# Patient Record
Sex: Female | Born: 1965 | Race: White | Hispanic: No | Smoking: Never smoker
Health system: Southern US, Community
[De-identification: ages and names within clinical notes are randomized; demographics above are authoritative.]

## PROBLEM LIST (undated history)

## (undated) DIAGNOSIS — F509 Eating disorder, unspecified: Secondary | ICD-10-CM

## (undated) DIAGNOSIS — D0439 Carcinoma in situ of skin of other parts of face: Secondary | ICD-10-CM

## (undated) DIAGNOSIS — R51 Headache: Secondary | ICD-10-CM

## (undated) DIAGNOSIS — M542 Cervicalgia: Secondary | ICD-10-CM

## (undated) DIAGNOSIS — M5030 Other cervical disc degeneration, unspecified cervical region: Secondary | ICD-10-CM

## (undated) DIAGNOSIS — M797 Fibromyalgia: Secondary | ICD-10-CM

## (undated) HISTORY — DX: Eating disorder, unspecified: F50.9

## (undated) HISTORY — PX: OTHER SURGICAL HISTORY: SHX169

## (undated) HISTORY — DX: Fibromyalgia: M79.7

## (undated) HISTORY — PX: TUBAL LIGATION: SHX77

## (undated) HISTORY — DX: Other cervical disc degeneration, unspecified cervical region: M50.30

## (undated) HISTORY — DX: Headache: R51

---

## 1998-08-12 ENCOUNTER — Ambulatory Visit (HOSPITAL_COMMUNITY): Admission: RE | Admit: 1998-08-12 | Discharge: 1998-08-12 | Payer: Self-pay | Admitting: Internal Medicine

## 1999-10-16 ENCOUNTER — Encounter: Admission: RE | Admit: 1999-10-16 | Discharge: 1999-10-16 | Payer: Self-pay | Admitting: Hematology and Oncology

## 1999-10-16 ENCOUNTER — Encounter: Payer: Self-pay | Admitting: Hematology and Oncology

## 2001-06-10 ENCOUNTER — Encounter: Admission: RE | Admit: 2001-06-10 | Discharge: 2001-06-10 | Payer: Self-pay | Admitting: Geriatric Medicine

## 2001-06-10 ENCOUNTER — Encounter: Payer: Self-pay | Admitting: Geriatric Medicine

## 2003-06-07 ENCOUNTER — Emergency Department (HOSPITAL_COMMUNITY): Admission: AD | Admit: 2003-06-07 | Discharge: 2003-06-07 | Payer: Self-pay | Admitting: Family Medicine

## 2003-07-08 ENCOUNTER — Other Ambulatory Visit: Admission: RE | Admit: 2003-07-08 | Discharge: 2003-07-08 | Payer: Self-pay | Admitting: Internal Medicine

## 2004-04-22 ENCOUNTER — Encounter: Admission: RE | Admit: 2004-04-22 | Discharge: 2004-04-22 | Payer: Self-pay | Admitting: Orthopaedic Surgery

## 2004-04-28 ENCOUNTER — Encounter: Admission: RE | Admit: 2004-04-28 | Discharge: 2004-06-02 | Payer: Self-pay | Admitting: Orthopaedic Surgery

## 2004-05-02 ENCOUNTER — Emergency Department (HOSPITAL_COMMUNITY): Admission: EM | Admit: 2004-05-02 | Discharge: 2004-05-02 | Payer: Self-pay | Admitting: Family Medicine

## 2004-05-05 ENCOUNTER — Emergency Department (HOSPITAL_COMMUNITY): Admission: EM | Admit: 2004-05-05 | Discharge: 2004-05-05 | Payer: Self-pay | Admitting: Family Medicine

## 2005-06-01 ENCOUNTER — Emergency Department (HOSPITAL_COMMUNITY): Admission: EM | Admit: 2005-06-01 | Discharge: 2005-06-01 | Payer: Self-pay | Admitting: Emergency Medicine

## 2005-07-24 ENCOUNTER — Encounter: Admission: RE | Admit: 2005-07-24 | Discharge: 2005-07-24 | Payer: Self-pay | Admitting: Internal Medicine

## 2006-08-16 ENCOUNTER — Other Ambulatory Visit: Admission: RE | Admit: 2006-08-16 | Discharge: 2006-08-16 | Payer: Self-pay | Admitting: Internal Medicine

## 2006-10-17 ENCOUNTER — Ambulatory Visit (HOSPITAL_COMMUNITY): Admission: RE | Admit: 2006-10-17 | Discharge: 2006-10-17 | Payer: Self-pay | Admitting: Obstetrics and Gynecology

## 2007-04-28 ENCOUNTER — Emergency Department (HOSPITAL_COMMUNITY): Admission: EM | Admit: 2007-04-28 | Discharge: 2007-04-28 | Payer: Self-pay | Admitting: Emergency Medicine

## 2007-07-10 ENCOUNTER — Emergency Department (HOSPITAL_COMMUNITY): Admission: EM | Admit: 2007-07-10 | Discharge: 2007-07-10 | Payer: Self-pay | Admitting: Family Medicine

## 2007-09-22 ENCOUNTER — Emergency Department (HOSPITAL_COMMUNITY): Admission: EM | Admit: 2007-09-22 | Discharge: 2007-09-22 | Payer: Self-pay | Admitting: Emergency Medicine

## 2007-10-01 ENCOUNTER — Emergency Department (HOSPITAL_COMMUNITY): Admission: EM | Admit: 2007-10-01 | Discharge: 2007-10-01 | Payer: Self-pay | Admitting: Family Medicine

## 2008-04-22 ENCOUNTER — Encounter: Admission: RE | Admit: 2008-04-22 | Discharge: 2008-04-22 | Payer: Self-pay | Admitting: Internal Medicine

## 2008-05-19 ENCOUNTER — Emergency Department (HOSPITAL_COMMUNITY): Admission: EM | Admit: 2008-05-19 | Discharge: 2008-05-19 | Payer: Self-pay | Admitting: Emergency Medicine

## 2008-08-24 ENCOUNTER — Other Ambulatory Visit: Admission: RE | Admit: 2008-08-24 | Discharge: 2008-08-24 | Payer: Self-pay | Admitting: Geriatric Medicine

## 2008-09-11 ENCOUNTER — Emergency Department (HOSPITAL_COMMUNITY): Admission: EM | Admit: 2008-09-11 | Discharge: 2008-09-11 | Payer: Self-pay | Admitting: Emergency Medicine

## 2008-11-24 ENCOUNTER — Emergency Department (HOSPITAL_COMMUNITY): Admission: EM | Admit: 2008-11-24 | Discharge: 2008-11-24 | Payer: Self-pay | Admitting: Emergency Medicine

## 2009-06-03 ENCOUNTER — Encounter: Admission: RE | Admit: 2009-06-03 | Discharge: 2009-06-03 | Payer: Self-pay | Admitting: Family Medicine

## 2010-06-12 ENCOUNTER — Other Ambulatory Visit: Payer: Self-pay | Admitting: Internal Medicine

## 2010-06-12 DIAGNOSIS — Z1231 Encounter for screening mammogram for malignant neoplasm of breast: Secondary | ICD-10-CM

## 2010-06-21 ENCOUNTER — Ambulatory Visit
Admission: RE | Admit: 2010-06-21 | Discharge: 2010-06-21 | Disposition: A | Discharge status: Home / Self Care | Payer: Self-pay | Source: Ambulatory Visit | Attending: Internal Medicine | Admitting: Internal Medicine

## 2010-06-21 DIAGNOSIS — Z1231 Encounter for screening mammogram for malignant neoplasm of breast: Secondary | ICD-10-CM

## 2010-08-08 ENCOUNTER — Ambulatory Visit (HOSPITAL_COMMUNITY): Payer: Self-pay | Admitting: Psychiatry

## 2010-08-13 LAB — BASIC METABOLIC PANEL
BUN: 6 mg/dL (ref 6–23)
CO2: 27 mEq/L (ref 19–32)
Calcium: 9.6 mg/dL (ref 8.4–10.5)
Chloride: 105 mEq/L (ref 96–112)
Creatinine, Ser: 0.7 mg/dL (ref 0.4–1.2)
GFR calc Af Amer: >60 mL/min (ref >60)
GFR calc non Af Amer: >60 mL/min (ref >60)
Glucose, Bld: 107 mg/dL — ABNORMAL HIGH (ref 70–99)
Potassium: 4.4 mEq/L (ref 3.5–5.1)
Sodium: 140 mEq/L (ref 135–145)

## 2010-08-13 LAB — CBC
HCT: 40.5 % (ref 36.0–46.0)
Hemoglobin: 13.3 g/dL (ref 12.0–15.0)
MCHC: 32.9 g/dL (ref 30.0–36.0)
MCV: 74.2 fL — ABNORMAL LOW (ref 78.0–100.0)
Platelets: 289 10*3/uL (ref 150–400)
RBC: 5.46 MIL/uL — ABNORMAL HIGH (ref 3.87–5.11)
RDW: 14.2 % (ref 11.5–15.5)
WBC: 10.7 10*3/uL — ABNORMAL HIGH (ref 4.0–10.5)

## 2010-08-13 LAB — DIFFERENTIAL
Basophils Absolute: 0.1 10*3/uL (ref 0.0–0.1)
Basophils Relative: 1 % (ref 0–1)
Eosinophils Absolute: 0.1 10*3/uL (ref 0.0–0.7)
Eosinophils Relative: 1 % (ref 0–5)
Lymphocytes Relative: 15 % (ref 12–46)
Lymphs Abs: 1.6 10*3/uL (ref 0.7–4.0)
Monocytes Absolute: 0.4 10*3/uL (ref 0.1–1.0)
Monocytes Relative: 4 % (ref 3–12)
Neutro Abs: 8.5 10*3/uL — ABNORMAL HIGH (ref 1.7–7.7)
Neutrophils Relative %: 80 % — ABNORMAL HIGH (ref 43–77)

## 2010-08-15 LAB — POCT RAPID STREP A (OFFICE): Streptococcus, Group A Screen (Direct): NEGATIVE

## 2010-08-21 LAB — RAPID STREP SCREEN (MED CTR MEBANE ONLY): Streptococcus, Group A Screen (Direct): NEGATIVE

## 2010-08-25 ENCOUNTER — Other Ambulatory Visit: Payer: Self-pay | Admitting: Internal Medicine

## 2010-08-25 ENCOUNTER — Ambulatory Visit
Admission: RE | Admit: 2010-08-25 | Discharge: 2010-08-25 | Disposition: A | Discharge status: Home / Self Care | Payer: BC Managed Care – PPO | Source: Ambulatory Visit | Attending: Internal Medicine | Admitting: Internal Medicine

## 2010-08-25 DIAGNOSIS — R1013 Epigastric pain: Secondary | ICD-10-CM

## 2010-09-19 NOTE — Op Note (Signed)
NAME:  Gabriela Schultz, Gabriela Schultz NO.:  [id number]   MEDICAL RECORD NO.:  [id number]          PATIENT TYPE:  AMB   LOCATION:  SDC                           FACILITY:  [facility]   PHYSICIAN:  Gerald Leitz, MD          DATE OF BIRTH:  26-Feb-1966   DATE OF PROCEDURE:  10/17/2006  DATE OF DISCHARGE:                               OPERATIVE REPORT   PREOPERATIVE DIAGNOSIS:  Desires permanent sterilization.   POSTOPERATIVE DIAGNOSIS:  Desires permanent sterilization.   PROCEDURE:  Laparoscopic bilateral tubal ligation with Filshie clips.   SURGEON:  Dr. Gerald Leitz   ASSISTANT:  None.   ANESTHESIA:  General.   SPECIMEN:  None.   ESTIMATED BLOOD LOSS:  Minimal, approximately 5-10 mL.   COMPLICATIONS:  None.   PROCEDURE:  Informed consent was obtained. The patient was taken to the  operating room where she was placed under general anesthesia. She was  placed in dorsal lithotomy position, prepped and draped in the usual  sterile fashion.  Bivalve speculum placed into the vaginal vault and a  uterine manipulator was placed. Bivalve speculum was removed.  She was  draped in a sterile fashion.  A 5 mm incision was made at the umbilicus  and a 5 mm Excel trocar was placed under direct visualization and  pneumoperitoneum was attained with CO2 gas. Ovaries and fallopian tubes  and uterus appeared normal. 10 mm incision was made 2 cm above the pubic  symphysis and the 10 mm trocar was placed under direct visualization.  Filshie clips were inserted through the 10 mm trocar. They were placed  on each fallopian tube bilaterally. Pneumoperitoneum and was released.  The trocars were removed from the patient's abdomen under direct  visualization. The fascia of the 10 mm incision was closed with 0 Vicryl  and the skin incisions were closed with 4-0 Vicryl.  Sponge, lap, needle  counts were correct x2. The patient was awakened from anesthesia, taken  to the recovery room in stable condition.   FINDINGS:  Normal uterus, ovaries and fallopian tubes.      Gerald Leitz, MD  Electronically Signed    TC/MEDQ  D:  10/17/2006  T:  10/17/2006  Job:  [id number]
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
## 2010-12-18 ENCOUNTER — Inpatient Hospital Stay (INDEPENDENT_AMBULATORY_CARE_PROVIDER_SITE_OTHER)
Admission: RE | Admit: 2010-12-18 | Discharge: 2010-12-18 | Disposition: A | Discharge status: Home / Self Care | Payer: BC Managed Care – PPO | Source: Ambulatory Visit | Attending: Family Medicine | Admitting: Family Medicine

## 2010-12-18 DIAGNOSIS — T6391XA Toxic effect of contact with unspecified venomous animal, accidental (unintentional), initial encounter: Secondary | ICD-10-CM

## 2011-01-02 ENCOUNTER — Ambulatory Visit (INDEPENDENT_AMBULATORY_CARE_PROVIDER_SITE_OTHER): Payer: BC Managed Care – PPO | Admitting: Psychiatry

## 2011-01-02 DIAGNOSIS — F329 Major depressive disorder, single episode, unspecified: Secondary | ICD-10-CM

## 2011-01-02 DIAGNOSIS — F3289 Other specified depressive episodes: Secondary | ICD-10-CM

## 2011-01-11 ENCOUNTER — Encounter (INDEPENDENT_AMBULATORY_CARE_PROVIDER_SITE_OTHER): Payer: BC Managed Care – PPO | Admitting: Psychiatry

## 2011-01-11 DIAGNOSIS — F329 Major depressive disorder, single episode, unspecified: Secondary | ICD-10-CM

## 2011-01-11 DIAGNOSIS — F3289 Other specified depressive episodes: Secondary | ICD-10-CM

## 2011-01-19 ENCOUNTER — Encounter (INDEPENDENT_AMBULATORY_CARE_PROVIDER_SITE_OTHER): Payer: BC Managed Care – PPO | Admitting: Psychiatry

## 2011-01-19 DIAGNOSIS — F329 Major depressive disorder, single episode, unspecified: Secondary | ICD-10-CM

## 2011-01-19 DIAGNOSIS — F3289 Other specified depressive episodes: Secondary | ICD-10-CM

## 2011-01-30 ENCOUNTER — Encounter (HOSPITAL_COMMUNITY): Payer: BC Managed Care – PPO | Admitting: Psychiatry

## 2011-02-06 ENCOUNTER — Encounter (INDEPENDENT_AMBULATORY_CARE_PROVIDER_SITE_OTHER): Payer: BC Managed Care – PPO | Admitting: Psychiatry

## 2011-02-06 DIAGNOSIS — F3289 Other specified depressive episodes: Secondary | ICD-10-CM

## 2011-02-06 DIAGNOSIS — F329 Major depressive disorder, single episode, unspecified: Secondary | ICD-10-CM

## 2011-02-20 ENCOUNTER — Encounter (INDEPENDENT_AMBULATORY_CARE_PROVIDER_SITE_OTHER): Payer: BC Managed Care – PPO | Admitting: Psychiatry

## 2011-02-20 DIAGNOSIS — F329 Major depressive disorder, single episode, unspecified: Secondary | ICD-10-CM

## 2011-02-20 DIAGNOSIS — F3289 Other specified depressive episodes: Secondary | ICD-10-CM

## 2011-02-22 LAB — CBC
HCT: 40.8
Hemoglobin: 13.1
MCHC: 32.1
MCV: 74.1 — ABNORMAL LOW
Platelets: 273
RBC: 5.51 — ABNORMAL HIGH
RDW: 13.9
WBC: 6.4

## 2011-02-22 LAB — SAMPLE TO BLOOD BANK

## 2011-02-22 LAB — PREGNANCY, URINE: Preg Test, Ur: NEGATIVE

## 2011-02-27 ENCOUNTER — Encounter (HOSPITAL_COMMUNITY): Payer: BC Managed Care – PPO | Admitting: Psychiatry

## 2011-10-17 ENCOUNTER — Other Ambulatory Visit: Payer: Self-pay | Admitting: Internal Medicine

## 2011-10-17 DIAGNOSIS — R102 Pelvic and perineal pain: Secondary | ICD-10-CM

## 2011-10-18 ENCOUNTER — Other Ambulatory Visit: Payer: BC Managed Care – PPO

## 2011-10-19 ENCOUNTER — Other Ambulatory Visit: Payer: BC Managed Care – PPO

## 2011-10-23 ENCOUNTER — Ambulatory Visit
Admission: RE | Admit: 2011-10-23 | Discharge: 2011-10-23 | Disposition: A | Discharge status: Home / Self Care | Payer: BC Managed Care – PPO | Source: Ambulatory Visit | Attending: Internal Medicine | Admitting: Internal Medicine

## 2011-10-23 DIAGNOSIS — R102 Pelvic and perineal pain: Secondary | ICD-10-CM

## 2011-11-27 ENCOUNTER — Other Ambulatory Visit: Payer: Self-pay | Admitting: Hematology & Oncology

## 2012-02-07 ENCOUNTER — Ambulatory Visit (INDEPENDENT_AMBULATORY_CARE_PROVIDER_SITE_OTHER): Payer: BC Managed Care – PPO | Admitting: Psychology

## 2012-02-07 DIAGNOSIS — F339 Major depressive disorder, recurrent, unspecified: Secondary | ICD-10-CM

## 2012-02-07 DIAGNOSIS — F419 Anxiety disorder, unspecified: Secondary | ICD-10-CM

## 2012-02-07 DIAGNOSIS — F411 Generalized anxiety disorder: Secondary | ICD-10-CM

## 2012-02-08 ENCOUNTER — Encounter (HOSPITAL_COMMUNITY): Payer: Self-pay | Admitting: Psychology
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
## 2012-02-18 ENCOUNTER — Ambulatory Visit (HOSPITAL_COMMUNITY): Payer: BC Managed Care – PPO | Admitting: Psychiatry

## 2012-03-04 ENCOUNTER — Ambulatory Visit (HOSPITAL_COMMUNITY): Payer: Self-pay | Admitting: Psychology

## 2012-04-03 ENCOUNTER — Encounter (HOSPITAL_COMMUNITY): Payer: Self-pay | Admitting: *Deleted

## 2012-04-03 ENCOUNTER — Emergency Department (HOSPITAL_COMMUNITY)
Admission: EM | Admit: 2012-04-03 | Discharge: 2012-04-03 | Disposition: A | Discharge status: Home / Self Care | Payer: BC Managed Care – PPO | Attending: Emergency Medicine | Admitting: Emergency Medicine

## 2012-04-03 DIAGNOSIS — Z79899 Other long term (current) drug therapy: Secondary | ICD-10-CM | POA: Insufficient documentation

## 2012-04-03 DIAGNOSIS — K644 Residual hemorrhoidal skin tags: Secondary | ICD-10-CM | POA: Insufficient documentation

## 2012-04-03 DIAGNOSIS — Z8659 Personal history of other mental and behavioral disorders: Secondary | ICD-10-CM | POA: Insufficient documentation

## 2012-04-03 DIAGNOSIS — K649 Unspecified hemorrhoids: Secondary | ICD-10-CM

## 2012-04-03 DIAGNOSIS — Z8739 Personal history of other diseases of the musculoskeletal system and connective tissue: Secondary | ICD-10-CM | POA: Insufficient documentation

## 2012-04-03 MED ORDER — HYDROCORTISONE ACETATE 25 MG RE SUPP: 25.0000 mg | Freq: Two times a day (BID) | RECTAL | Status: DC

## 2012-04-03 MED ORDER — OXYCODONE-ACETAMINOPHEN 5-325 MG PO TABS: 1.0000 | ORAL_TABLET | Freq: Four times a day (QID) | ORAL | Status: DC | PRN

## 2012-04-03 MED ORDER — ONDANSETRON HCL 4 MG PO TABS
4.0000 mg | ORAL_TABLET | Freq: Once | ORAL | Status: AC
  Administered 2012-04-03: 4 mg via ORAL
  Filled 2012-04-03: qty 1

## 2012-04-03 MED ORDER — OXYCODONE-ACETAMINOPHEN 5-325 MG PO TABS
1.0000 | ORAL_TABLET | Freq: Once | ORAL | Status: AC
  Administered 2012-04-03: 1 via ORAL
  Filled 2012-04-03: qty 1

## 2012-04-03 NOTE — ED Provider Notes (Signed)
Medical screening examination/treatment/procedure(s) were performed by non-physician practitioner and as supervising physician I was immediately available for consultation/collaboration. Devoria Albe, MD, Armando Gang   Ward Givens, MD 04/03/12 [phone]

## 2012-04-03 NOTE — ED Notes (Signed)
Pt presents with rectal pain secondary to Hemorid. Pt denies bleeding. Pt states has not had a BM today. Pain has increased last night.

## 2012-04-03 NOTE — ED Provider Notes (Signed)
History     CSN: [id number]  Arrival date & time 04/03/12  2104   First MD Initiated Contact with Patient 04/03/12 2152      Chief Complaint  Patient presents with  . Hemorrhoids    (Consider location/radiation/quality/duration/timing/severity/associated sxs/prior treatment) HPI Comments: Pt present to the ED with a 2 day hx of hemorrhoid pain. She notes the pain is getting worse. She used a Ship broker and noted a black area on the hemorrhoid. She states she can not take the pain any more and request assistance. No previous hemorrhoid/rectal  Surgery. She has tried tylenol an motrin without improvement.  The history is provided by the patient.    Past Medical History  Diagnosis Date  . Eating disorder   . Headache   . Fibromyalgia     Past Surgical History  Procedure Date  . Tubal ligation   . 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 Anxiety disorder Mother   . Anxiety disorder Maternal Aunt   . Depression Maternal Grandmother   . Depression Paternal Grandmother     History  Substance Use Topics  . Smoking status: Never Smoker   . Smokeless tobacco: Never Used  . Alcohol Use: No    OB History    Grav Para Term Preterm Abortions TAB SAB Ect Mult Living                  Review of Systems  Constitutional: Negative for activity change.       All ROS Neg except as noted in HPI  HENT: Negative for nosebleeds and neck pain.   Eyes: Negative for photophobia and discharge.  Respiratory: Negative for cough, shortness of breath and wheezing.   Cardiovascular: Negative for chest pain and palpitations.  Gastrointestinal: Positive for constipation and rectal pain. Negative for abdominal pain and blood in stool.  Genitourinary: Negative for dysuria, frequency and hematuria.  Musculoskeletal: Negative for back pain and arthralgias.  Skin: Negative.   Neurological: Negative for dizziness, seizures and  speech difficulty.  Psychiatric/Behavioral: Negative for hallucinations and confusion.    Allergies  Erythromycin; Flagyl; and Vicodin  Home Medications   Current Outpatient Rx  Name  Route  Sig  Dispense  Refill  . ACETAMINOPHEN 500 MG PO TABS   Oral   Take 1,000 mg by mouth daily as needed. For occasional pain         . ALPRAZOLAM 1 MG PO TABS   Oral   Take 0.5 mg by mouth daily as needed. For anxiety         . AMINO ACID PO   Oral   Take 2 tablets by mouth daily. [employer] Supp[employer]:         . DROSPIREN-ETH ESTRAD-LEVOMEFOL 3-0.02-0.451 MG PO TABS   Oral   Take 1 tablet by mouth at bedtime.         . OMEGA-3 FATTY ACIDS 1000 MG PO CAPS   Oral   Take 1 g by mouth every morning.         Marland Kitchen FLUOXETINE HCL 20 MG PO TABS   Oral   Take 60 mg by mouth at bedtime.          Marland Kitchen PREGABALIN 50 MG PO CAPS   Oral   Take 50 mg by mouth at bedtime.         Marland Kitchen PREPARATION H EX   Apply externally  Apply 1 application topically daily as needed. For relief           BP 101/78  Pulse 82  Temp 97.7 F (36.5 C) (Oral)  Resp 20  Ht 5\' 3"  (1.6 m)  Wt 149 lb (67.586 kg)  BMI 26.39 kg/m2  SpO2 100%  LMP 03/29/2012  Physical Exam  Nursing note and vitals reviewed. Constitutional: She is oriented to person, place, and time. She appears well-developed and well-nourished.  Non-toxic appearance.  HENT:  Head: Normocephalic.  Right Ear: Tympanic membrane and external ear normal.  Left Ear: Tympanic membrane and external ear normal.  Eyes: EOM and lids are normal. Pupils are equal, round, and reactive to light.  Neck: Normal range of motion. Neck supple. Carotid bruit is not present.  Cardiovascular: Normal rate, regular rhythm, normal heart sounds, intact distal pulses and normal pulses.   Pulmonary/Chest: Breath sounds normal. No respiratory distress.  Abdominal: Soft. Bowel sounds are normal. There is no tenderness. There is no guarding.  Genitourinary:        Hemorrhoid at the 10:00 position. Small area or dark blue/black at the tip of the hemorrhoid. No noted fistula or abscess in the rectal area. Good sphincter tone on limited exam. No red streaking. Could not assess for internal hemorrhoid. Chaperone in room during exam.  Musculoskeletal: Normal range of motion.  Lymphadenopathy:       Head (right side): No submandibular adenopathy present.       Head (left side): No submandibular adenopathy present.    She has no cervical adenopathy.  Neurological: She is alert and oriented to person, place, and time. She has normal strength. No cranial nerve deficit or sensory deficit.  Skin: Skin is warm and dry.  Psychiatric: She has a normal mood and affect. Her speech is normal.    ED Course  Procedures (including critical care time)  Labs Reviewed - No data to display No results found.   No diagnosis found.    MDM  I have reviewed nursing notes, vital signs, and all appropriate lab and imaging results for this patient. Pt has an external hemorrhoid. Pt advised to increase water and  Juices. Use warm tub soaks, and mirralax. Pt to see Dr Lovell Sheehan for surgical eval, if not improving. Rx for percocet and anusol HC given to the patient.      Kathie Dike, [place] 04/03/12 2250

## 2012-04-03 NOTE — ED Notes (Signed)
Pt with rectal pain since last night, hurts worse with standing

## 2012-06-08 ENCOUNTER — Emergency Department (HOSPITAL_COMMUNITY)
Admission: EM | Admit: 2012-06-08 | Discharge: 2012-06-08 | Disposition: A | Discharge status: Home / Self Care | Payer: BC Managed Care – PPO | Attending: Emergency Medicine | Admitting: Emergency Medicine

## 2012-06-08 ENCOUNTER — Encounter (HOSPITAL_COMMUNITY): Payer: Self-pay | Admitting: Emergency Medicine

## 2012-06-08 DIAGNOSIS — J029 Acute pharyngitis, unspecified: Secondary | ICD-10-CM | POA: Insufficient documentation

## 2012-06-08 DIAGNOSIS — H5789 Other specified disorders of eye and adnexa: Secondary | ICD-10-CM | POA: Insufficient documentation

## 2012-06-08 DIAGNOSIS — R509 Fever, unspecified: Secondary | ICD-10-CM | POA: Insufficient documentation

## 2012-06-08 DIAGNOSIS — J111 Influenza due to unidentified influenza virus with other respiratory manifestations: Secondary | ICD-10-CM

## 2012-06-08 DIAGNOSIS — R52 Pain, unspecified: Secondary | ICD-10-CM | POA: Insufficient documentation

## 2012-06-08 DIAGNOSIS — J3489 Other specified disorders of nose and nasal sinuses: Secondary | ICD-10-CM | POA: Insufficient documentation

## 2012-06-08 DIAGNOSIS — Z8739 Personal history of other diseases of the musculoskeletal system and connective tissue: Secondary | ICD-10-CM | POA: Insufficient documentation

## 2012-06-08 DIAGNOSIS — R51 Headache: Secondary | ICD-10-CM | POA: Insufficient documentation

## 2012-06-08 DIAGNOSIS — Z79899 Other long term (current) drug therapy: Secondary | ICD-10-CM | POA: Insufficient documentation

## 2012-06-08 MED ORDER — OSELTAMIVIR PHOSPHATE 75 MG PO CAPS: 75.0000 mg | ORAL_CAPSULE | Freq: Two times a day (BID) | ORAL | Status: DC

## 2012-06-08 MED ORDER — GUAIFENESIN-CODEINE 100-10 MG/5ML PO SYRP: 5.0000 mL | ORAL_SOLUTION | Freq: Three times a day (TID) | ORAL | Status: DC | PRN

## 2012-06-08 NOTE — ED Notes (Signed)
Pt c/o cough/chills/body aches/sore throat since Friday. Denies n/v/d.

## 2012-06-08 NOTE — ED Provider Notes (Signed)
Medical screening examination/treatment/procedure(s) were performed by non-physician practitioner and as supervising physician I was immediately available for consultation/collaboration.   Lyanne Co, MD 06/08/12 [phone]

## 2012-06-08 NOTE — ED Notes (Signed)
Pt with NP cough, body aches, fever, HA and sore throat since Friday evening, taking NyQuil and last took this morning, denies any N/V/D

## 2012-06-08 NOTE — ED Provider Notes (Signed)
History     CSN: [id number]  Arrival date & time 06/08/12  1322   First MD Initiated Contact with Patient 06/08/12 1422      Chief Complaint  Patient presents with  . Cough  . Generalized Body Aches  . Sore Throat  . Chills    HPI Gabriela Schultz is a 47 y.o. female who presents to the ED with flu like symptoms. The symptoms started 2 days ago and include cough, sore throat,  Fever, chills and generalized body aches. Temp has been up to 101. She did not have her flu vaccine this year. Taking OTC medication without relief.  The history was provided by the patient.  Past Medical History  Diagnosis Date  . Eating disorder   . Headache   . Fibromyalgia     Past Surgical History  Procedure Date  . Tubal ligation   . 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 Anxiety disorder Mother   . Anxiety disorder Maternal Aunt   . Depression Maternal Grandmother   . Depression Paternal Grandmother     History  Substance Use Topics  . Smoking status: Never Smoker   . Smokeless tobacco: Never Used  . Alcohol Use: No    OB History    Grav Para Term Preterm Abortions TAB SAB Ect Mult Living                  Review of Systems  Constitutional: Positive for fever and chills. Negative for diaphoresis and fatigue.  HENT: Positive for congestion and sore throat. Negative for ear pain, facial swelling, neck pain, neck stiffness, dental problem and sinus pressure.   Eyes: Positive for redness. Negative for photophobia, pain and discharge.  Respiratory: Positive for cough. Negative for chest tightness and wheezing.   Gastrointestinal: Negative for nausea, vomiting, abdominal pain, diarrhea, constipation and abdominal distention.  Genitourinary: Negative for dysuria, frequency, flank pain and difficulty urinating.  Musculoskeletal: Positive for myalgias. Negative for back pain and gait problem.  Skin: Negative for color  change and rash.  Neurological: Positive for headaches. Negative for dizziness, speech difficulty, weakness, light-headedness and numbness.  Psychiatric/Behavioral: Negative for confusion and agitation. The patient is not nervous/anxious.     Allergies  Erythromycin; Flagyl; and Vicodin  Home Medications   Current Outpatient Rx  Name  Route  Sig  Dispense  Refill  . ACETAMINOPHEN 500 MG PO TABS   Oral   Take 1,000 mg by mouth daily as needed. For occasional pain         . ALPRAZOLAM 1 MG PO TABS   Oral   Take 0.5 mg by mouth daily as needed. For anxiety         . AMINO ACID PO   Oral   Take 2 tablets by mouth daily. [employer] Supp[employer]:         . DROSPIREN-ETH ESTRAD-LEVOMEFOL 3-0.02-0.451 MG PO TABS   Oral   Take 1 tablet by mouth at bedtime.         . OMEGA-3 FATTY ACIDS 1000 MG PO CAPS   Oral   Take 1 g by mouth every morning.         Marland Kitchen FLUOXETINE HCL 20 MG PO TABS   Oral   Take 60 mg by mouth at bedtime.          Marland Kitchen HYDROCORTISONE ACETATE 25 MG RE SUPP  Rectal   Place 1 suppository (25 mg total) rectally 2 (two) times daily.   12 suppository   0   . OXYCODONE-ACETAMINOPHEN 5-325 MG PO TABS   Oral   Take 1 tablet by mouth every 6 (six) hours as needed for pain.   15 tablet   0     Please take with food   . PREGABALIN 50 MG PO CAPS   Oral   Take 50 mg by mouth at bedtime.         Marland Kitchen PREPARATION H EX   Apply externally   Apply 1 application topically daily as needed. For relief           BP 117/89  Pulse 114  Temp 98.3 F (36.8 C) (Oral)  Resp 20  Ht 5\' 3"  (1.6 m)  Wt 150 lb (68.04 kg)  BMI 26.57 kg/m2  SpO2 100%  LMP 06/02/2012  Physical Exam  Nursing note and vitals reviewed. Constitutional: She is oriented to person, place, and time. She appears well-developed and well-nourished. No distress.  HENT:  Head: Normocephalic and atraumatic.  Right Ear: Tympanic membrane, external ear and ear canal normal.  Left Ear: Tympanic  membrane, external ear and ear canal normal.  Nose: Mucosal edema present.  Mouth/Throat: Uvula is midline and mucous membranes are normal. Posterior oropharyngeal erythema present. No oropharyngeal exudate or posterior oropharyngeal edema.  Eyes: EOM are normal. Pupils are equal, round, and reactive to light.  Neck: Neck supple.  Cardiovascular:       tachycardia  Pulmonary/Chest: Effort normal and breath sounds normal. No respiratory distress.  Abdominal: Soft. There is no tenderness.  Musculoskeletal: Normal range of motion. She exhibits no edema.  Neurological: She is alert and oriented to person, place, and time. No cranial nerve deficit.  Skin: Skin is warm and dry.  Psychiatric: She has a normal mood and affect. Her behavior is normal. Judgment and thought content normal.   Procedures   Assessment: 47 y.o. female with flu like symptoms   Influenza  Plan:  Tamiflu   Robitussin AC   Rest, fluids Discussed with the patient and all questioned fully answered. She will return if any problems arise.    Medication List     As of 06/08/2012  3:18 PM    START taking these medications         guaiFENesin-codeine 100-10 MG/5ML syrup   Commonly known as: ROBITUSSIN AC   Take 5 mLs by mouth 3 (three) times daily as needed for cough.      oseltamivir 75 MG capsule   Commonly known as: TAMIFLU   Take 1 capsule (75 mg total) by mouth every 12 (twelve) hours.      ASK your doctor about these medications         acetaminophen 500 MG tablet   Commonly known as: TYLENOL      ALPRAZolam 1 MG tablet   Commonly known as: XANAX      BEYAZ 3-0.02-0.451 MG tablet   Generic drug: Drospirenone-Ethinyl Estradiol-Levomefol      fish oil-omega-3 fatty acids 1000 MG capsule      FLUoxetine 20 MG tablet   Commonly known as: PROZAC          Where to get your medications    These are the prescriptions that you need to pick up.   You may get these medications from any pharmacy.          guaiFENesin-codeine 100-10 MG/5ML syrup   oseltamivir  75 MG capsule                [facility], [place] 06/08/12 [phone]

## 2012-06-11 ENCOUNTER — Ambulatory Visit (HOSPITAL_COMMUNITY): Payer: Self-pay | Admitting: Psychiatry

## 2012-06-19 ENCOUNTER — Ambulatory Visit (HOSPITAL_COMMUNITY): Payer: Self-pay | Admitting: Psychiatry

## 2012-06-24 ENCOUNTER — Ambulatory Visit (INDEPENDENT_AMBULATORY_CARE_PROVIDER_SITE_OTHER): Payer: BC Managed Care – PPO | Admitting: Psychiatry

## 2012-06-24 DIAGNOSIS — F339 Major depressive disorder, recurrent, unspecified: Secondary | ICD-10-CM

## 2012-06-24 DIAGNOSIS — F411 Generalized anxiety disorder: Secondary | ICD-10-CM

## 2012-06-24 DIAGNOSIS — F419 Anxiety disorder, unspecified: Secondary | ICD-10-CM

## 2012-06-25 NOTE — Progress Notes (Signed)
Patient:  Gabriela Schultz   DOB: [birth year]  MR Number: [id number]  [facility]  [address] [place],  [place], [address]  Start: Tuesday 06/24/2012 3:00 PM End: Tuesday 06/24/2012 3:55 PM  Provider/Observer:     Florencia Reasons, MSW, LCSW   Chief Complaint:      Chief Complaint  Patient presents with  . Depression  . Anxiety    Reason For Service:     The patient is a returning patient to this practice and this clinician. She was last seen in this practice by Dr. Kieth Brightly in October 2013. She has a long history of symptoms of anxiety and depression that have worsened in recent weeks. 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   Interventions Strategy:   supportive therapy  Participation Level:   Active  Participation Quality:  Appropriate      Behavioral Observation:  Casual, Alert, and Appropriate.   Current Psychosocial Factors: Trust issues in marriage. Discord with in-laws.   Content of Session:   Reviewing symptoms, establishing rapport, gathering information  Current Status:   Patient reports depressed mood, anxiety, excessive worrying, excessive sleeping, and decreased appetite. She denies suicidal and homicidal ideations. She reports no psychotic symptoms.  Patient Progress:   Fair. Patient reports increased stress due to trust issues with her husband and feeling very insecure about their current residence as patient's name is not on the deed for  the home. She fears that her husband's family will put her out of the house if something happens to her husband. She expresses ambivalent feelings about continuing to reside with her husband and has considered moving out but remaining in the marriage. She expresses frustration and disappointment with her husband as he has withheld information from patient. She also expresses frustration as she states that her husband will not stand up to his mother, ex-wife, or his daughter. 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 She states being perfectionistic and constantly asking self is it  good enough. Patient also enjoys her 2 young grandchildren and sees them 2- 3 times per week.  Target Goals:   Establishing rapport  Last Reviewed:     Goals Addressed Today:    Establishing rapport  Impression/Diagnosis:   Patient presents with a history of mental health issues since 1991 at which time she sought treatment and was diagnosed with anorexia. She also has experienced recurrent periods of depression along with anxiety. Symptoms have worsened in recent months due to to marital stress, insecurity and instability regarding current residence, and her job performance. Current symptoms include depressed mood, anxiety, excessive worrying, excessive sleeping, and decreased appetite. Diagnoses: Major depressive disorder, recurrent, anxiety disorder NOS  Diagnosis:  Axis I: Major depressive disorder, recurrent  Anxiety disorder          Axis II: No diagnosis

## 2012-06-25 NOTE — Patient Instructions (Signed)
Discussed orally 

## 2012-07-04 ENCOUNTER — Other Ambulatory Visit: Payer: Self-pay

## 2012-07-04 ENCOUNTER — Other Ambulatory Visit (HOSPITAL_COMMUNITY)
Admission: RE | Admit: 2012-07-04 | Discharge: 2012-07-04 | Disposition: A | Discharge status: Home / Self Care | Payer: BC Managed Care – PPO | Source: Ambulatory Visit | Attending: Obstetrics and Gynecology | Admitting: Obstetrics and Gynecology

## 2012-07-04 ENCOUNTER — Other Ambulatory Visit: Payer: Self-pay | Admitting: Obstetrics and Gynecology

## 2012-07-04 DIAGNOSIS — Z01419 Encounter for gynecological examination (general) (routine) without abnormal findings: Secondary | ICD-10-CM | POA: Insufficient documentation

## 2012-07-04 DIAGNOSIS — Z1151 Encounter for screening for human papillomavirus (HPV): Secondary | ICD-10-CM | POA: Insufficient documentation

## 2012-07-04 DIAGNOSIS — Z1231 Encounter for screening mammogram for malignant neoplasm of breast: Secondary | ICD-10-CM

## 2012-07-08 ENCOUNTER — Ambulatory Visit (HOSPITAL_COMMUNITY): Payer: Self-pay | Admitting: Psychiatry

## 2012-07-15 ENCOUNTER — Ambulatory Visit (HOSPITAL_COMMUNITY): Payer: Self-pay | Admitting: Psychiatry

## 2012-07-18 ENCOUNTER — Ambulatory Visit (HOSPITAL_COMMUNITY): Payer: Self-pay | Admitting: Psychiatry

## 2012-07-29 ENCOUNTER — Ambulatory Visit (HOSPITAL_COMMUNITY): Payer: Self-pay | Admitting: Psychiatry

## 2012-08-04 ENCOUNTER — Ambulatory Visit: Payer: Self-pay

## 2012-09-01 ENCOUNTER — Ambulatory Visit: Payer: Self-pay

## 2012-09-11 ENCOUNTER — Emergency Department (HOSPITAL_COMMUNITY)
Admission: EM | Admit: 2012-09-11 | Discharge: 2012-09-12 | Disposition: A | Discharge status: Home / Self Care | Payer: BC Managed Care – PPO | Attending: Emergency Medicine | Admitting: Emergency Medicine

## 2012-09-11 ENCOUNTER — Encounter (HOSPITAL_COMMUNITY): Payer: Self-pay | Admitting: Emergency Medicine

## 2012-09-11 DIAGNOSIS — K219 Gastro-esophageal reflux disease without esophagitis: Secondary | ICD-10-CM | POA: Insufficient documentation

## 2012-09-11 DIAGNOSIS — R1013 Epigastric pain: Secondary | ICD-10-CM | POA: Insufficient documentation

## 2012-09-11 DIAGNOSIS — R109 Unspecified abdominal pain: Secondary | ICD-10-CM

## 2012-09-11 DIAGNOSIS — Z79899 Other long term (current) drug therapy: Secondary | ICD-10-CM | POA: Insufficient documentation

## 2012-09-11 DIAGNOSIS — R143 Flatulence: Secondary | ICD-10-CM | POA: Insufficient documentation

## 2012-09-11 DIAGNOSIS — R112 Nausea with vomiting, unspecified: Secondary | ICD-10-CM | POA: Insufficient documentation

## 2012-09-11 DIAGNOSIS — R142 Eructation: Secondary | ICD-10-CM | POA: Insufficient documentation

## 2012-09-11 DIAGNOSIS — R141 Gas pain: Secondary | ICD-10-CM | POA: Insufficient documentation

## 2012-09-11 DIAGNOSIS — Z8659 Personal history of other mental and behavioral disorders: Secondary | ICD-10-CM | POA: Insufficient documentation

## 2012-09-11 DIAGNOSIS — Z8739 Personal history of other diseases of the musculoskeletal system and connective tissue: Secondary | ICD-10-CM | POA: Insufficient documentation

## 2012-09-11 LAB — CBC WITH DIFFERENTIAL/PLATELET
Basophils Absolute: 0 10*3/uL (ref 0.0–0.1)
Basophils Relative: 0 % (ref 0–1)
Eosinophils Absolute: 0 10*3/uL (ref 0.0–0.7)
Eosinophils Relative: 0 % (ref 0–5)
HCT: 39.8 % (ref 36.0–46.0)
Hemoglobin: 13.1 g/dL (ref 12.0–15.0)
Lymphocytes Relative: 22 % (ref 12–46)
Lymphs Abs: 2.1 10*3/uL (ref 0.7–4.0)
MCH: 23.9 pg — ABNORMAL LOW (ref 26.0–34.0)
MCHC: 32.9 g/dL (ref 30.0–36.0)
MCV: 72.5 fL — ABNORMAL LOW (ref 78.0–100.0)
Monocytes Absolute: 0.4 10*3/uL (ref 0.1–1.0)
Monocytes Relative: 4 % (ref 3–12)
Neutro Abs: 6.9 10*3/uL (ref 1.7–7.7)
Neutrophils Relative %: 74 % (ref 43–77)
Platelets: 220 10*3/uL (ref 150–400)
RBC: 5.49 MIL/uL — ABNORMAL HIGH (ref 3.87–5.11)
RDW: 13.8 % (ref 11.5–15.5)
WBC: 9.4 10*3/uL (ref 4.0–10.5)

## 2012-09-11 LAB — URINALYSIS, ROUTINE W REFLEX MICROSCOPIC
Bilirubin Urine: NEGATIVE
Glucose, UA: NEGATIVE mg/dL
Hgb urine dipstick: NEGATIVE
Ketones, ur: NEGATIVE mg/dL
Nitrite: NEGATIVE
Protein, ur: NEGATIVE mg/dL
Specific Gravity, Urine: 1.01 (ref 1.005–1.030)
Urobilinogen, UA: 4 mg/dL — ABNORMAL HIGH (ref 0.0–1.0)
pH: 6.5 (ref 5.0–8.0)

## 2012-09-11 LAB — COMPREHENSIVE METABOLIC PANEL
ALT: 39 U/L — ABNORMAL HIGH (ref 0–35)
AST: 36 U/L (ref 0–37)
Albumin: 3.6 g/dL (ref 3.5–5.2)
Alkaline Phosphatase: 79 U/L (ref 39–117)
BUN: 5 mg/dL — ABNORMAL LOW (ref 6–23)
CO2: 23 mEq/L (ref 19–32)
Calcium: 9 mg/dL (ref 8.4–10.5)
Chloride: 103 mEq/L (ref 96–112)
Creatinine, Ser: 0.67 mg/dL (ref 0.50–1.10)
GFR calc Af Amer: >90 mL/min (ref >90)
GFR calc non Af Amer: >90 mL/min (ref >90)
Glucose, Bld: 105 mg/dL — ABNORMAL HIGH (ref 70–99)
Potassium: 3.6 mEq/L (ref 3.5–5.1)
Sodium: 139 mEq/L (ref 135–145)
Total Bilirubin: 0.7 mg/dL (ref 0.3–1.2)
Total Protein: 7.4 g/dL (ref 6.0–8.3)

## 2012-09-11 LAB — LIPASE, BLOOD: Lipase: 27 U/L (ref 11–59)

## 2012-09-11 LAB — URINE MICROSCOPIC-ADD ON

## 2012-09-11 MED ORDER — PANTOPRAZOLE SODIUM 40 MG IV SOLR
40.0000 mg | Freq: Once | INTRAVENOUS | Status: AC
  Administered 2012-09-11: 40 mg via INTRAVENOUS
  Filled 2012-09-11: qty 40

## 2012-09-11 MED ORDER — DIPHENHYDRAMINE HCL 50 MG/ML IJ SOLN
25.0000 mg | Freq: Once | INTRAMUSCULAR | Status: AC
  Administered 2012-09-11: via INTRAVENOUS
  Filled 2012-09-11: qty 1

## 2012-09-11 MED ORDER — SODIUM CHLORIDE 0.9 % IV SOLN: 1000.0000 mL | INTRAVENOUS | Status: DC

## 2012-09-11 MED ORDER — METOCLOPRAMIDE HCL 5 MG/ML IJ SOLN
10.0000 mg | Freq: Once | INTRAMUSCULAR | Status: AC
  Administered 2012-09-11: 10 mg via INTRAVENOUS
  Filled 2012-09-11: qty 2

## 2012-09-11 MED ORDER — SODIUM CHLORIDE 0.9 % IV SOLN
1000.0000 mL | Freq: Once | INTRAVENOUS | Status: AC
  Administered 2012-09-11: 1000 mL via INTRAVENOUS

## 2012-09-11 NOTE — ED Notes (Signed)
Pt states she has not vomited since Tuesday, but c/o nausea and abd pain.

## 2012-09-11 NOTE — ED Provider Notes (Signed)
History  This chart was scribed for Ward Givens, MD by Bennett Scrape, ED Scribe. This patient was seen in room [facility] and the patient's care was started at 10:17 PM.  CSN: [id number]  Arrival date & time 09/11/12  2030   First MD Initiated Contact with Patient 09/11/12 2146      Chief Complaint  Patient presents with  . Vomiting  . Abdominal Pain  . Nausea     Patient is a 47 y.o. female presenting with abdominal pain. The history is provided by the patient. No language interpreter was used.  Abdominal Pain Pain location:  Epigastric Pain quality: pressure   Pain radiates to:  Does not radiate Pain severity:  Mild Onset quality:  Gradual Duration:  4 days Timing:  Constant Progression:  Worsening Chronicity:  New Context: sick contacts and suspicious food intake   Relieved by:  Nothing Worsened by:  Eating Ineffective treatments:  None tried Associated symptoms: belching, nausea and vomiting (now resolved)   Associated symptoms: no chills, no diarrhea and no fever     HPI Comments: Gabriela Schultz is a 47 y.o. female who presents to the Emergency Department complaining of 4 days of gradual onset, gradually worsening, constant, epigastric abdominal pain described as non-radiating pressure with associated nausea and emesis. She states that she feels lightheaded with standing only but denies any syncopal episodes. The pain is worse with eating and drinking and she denies any alleviating factors. Pt reports that she attended a bridal shower party before the onset and states that she felt like the food she had eaten "was just sitting there like a rock". The next day after getting to work she developed nausea and a couple hours later began vomiting. She states that she had emesis 3 days ago and it burned when it came out. She denies any further episodes but states that she started having "rotten egg" tasting burps today. She confirms one sick contact with diarrhea but sick  contact stated that she was improving. Pt reports that she has been drinking water and Gatorade to stay hydrated since the onset.  She states that she is still urinating and is appears to be a normal color. She denies any prior h/o cholecystectomy and she denies having a family h/o gallbladder disease. She denies any prior diagnoses of ulcers. She admits that she has a h/o "nervous stomach" where she will get diarrhea but denies similarities. She denies fevers and diarrhea as associated symptoms.   She has a h/o fibromyalgia. Pt denies smoking and alcohol use.  PCP Dr Nehemiah Settle  Past Medical History  Diagnosis Date  . Eating disorder   . Headache   . Fibromyalgia     Past Surgical History  Procedure Laterality Date  . Tubal ligation    . 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 Anxiety disorder Mother   . Anxiety disorder Maternal Aunt   . Depression Maternal Grandmother   . Depression Paternal Grandmother     History  Substance Use Topics  . Smoking status: Never Smoker   . Smokeless tobacco: Never Used  . Alcohol Use: No   Employed   No OB history provided.   Review of Systems  Constitutional: Negative for fever and chills.  Gastrointestinal: Positive for nausea, vomiting (now resolved) and abdominal pain. Negative for diarrhea.  All other systems reviewed and are negative.  Allergies  Erythromycin; Flagyl; and Vicodin  Home Medications   Current Outpatient Rx  Name  Route  Sig  Dispense  Refill  . calcium carbonate (TUMS - DOSED IN MG ELEMENTAL CALCIUM) 500 MG chewable tablet   Oral   Chew 1 tablet by mouth daily as needed for heartburn.         . Drospirenone-Ethinyl Estradiol-Levomefol (BEYAZ) 3-0.02-0.451 MG tablet   Oral   Take 1 tablet by mouth at bedtime.         . fish oil-omega-3 fatty acids 1000 MG capsule   Oral   Take 1 g by mouth every morning.         Marland Kitchen  FLUoxetine (PROZAC) 20 MG tablet   Oral   Take 60 mg by mouth at bedtime.          Marland Kitchen omeprazole (PRILOSEC OTC) 20 MG tablet   Oral   Take 20 mg by mouth daily as needed (for heartburn).         . Simethicone (GAS-X EXTRA STRENGTH) 125 MG CAPS   Oral   Take 125 mg by mouth daily as needed (for relief).         Marland Kitchen acetaminophen (TYLENOL) 500 MG tablet   Oral   Take 1,000 mg by mouth daily as needed. For occasional pain         . ALPRAZolam (XANAX) 1 MG tablet   Oral   Take 0.5 mg by mouth daily as needed. For anxiety           Triage Vitals: BP 140/91  Pulse 100  Temp(Src) 98.2 F (36.8 C) (Oral)  Resp 20  Ht 5\' 3"  (1.6 m)  Wt 140 lb (63.504 kg)  BMI 24.81 kg/m2  SpO2 100%  LMP 08/19/2012   Vital signs normal    Physical Exam  Nursing note and vitals reviewed. Constitutional: She is oriented to person, place, and time. She appears well-developed and well-nourished.  Non-toxic appearance. She does not appear ill. No distress.  HENT:  Head: Normocephalic and atraumatic.  Right Ear: External ear normal.  Left Ear: External ear normal.  Nose: Nose normal. No mucosal edema or rhinorrhea.  Mouth/Throat: Mucous membranes are normal. No dental abscesses or edematous.  Dry, cracked lips, tongue is mildly dry  Eyes: Conjunctivae and EOM are normal. Pupils are equal, round, and reactive to light.  Neck: Normal range of motion and full passive range of motion without pain. Neck supple.  Cardiovascular: Normal rate, regular rhythm and normal heart sounds.  Exam reveals no gallop and no friction rub.   No murmur heard. Pulmonary/Chest: Effort normal and breath sounds normal. No respiratory distress. She has no wheezes. She has no rhonchi. She has no rales. She exhibits no tenderness and no crepitus.  Abdominal: Soft. Normal appearance. She exhibits no distension. Bowel sounds are increased. There is tenderness (tenderness to the epigastrium and LUQ). There is no rebound  and no guarding.    Musculoskeletal: Normal range of motion. She exhibits no edema and no tenderness.  Moves all extremities well.   Neurological: She is alert and oriented to person, place, and time. She has normal strength. No cranial nerve deficit.  Skin: Skin is warm, dry and intact. No rash noted. No erythema. No pallor.  Psychiatric: She has a normal mood and affect. Her speech is normal and behavior is normal. Her mood appears not anxious.    ED Course  Procedures (including critical care time)  Medications  0.9 %  sodium chloride infusion (1,000 mLs Intravenous New Bag/Given 09/11/12 2334)    Followed by  0.9 %  sodium chloride infusion (not administered)  sodium chloride 0.9 % bolus 1,000 mL (not administered)  metoCLOPramide (REGLAN) injection 10 mg (10 mg Intravenous Given 09/11/12 2334)  diphenhydrAMINE (BENADRYL) injection 25 mg ( Intravenous Given 09/11/12 2333)  pantoprazole (PROTONIX) injection 40 mg (40 mg Intravenous Given 09/11/12 2334)  ondansetron (ZOFRAN) injection 4 mg (4 mg Intravenous Given 09/12/12 0007)    DIAGNOSTIC STUDIES: Oxygen Saturation is 100% on room air, normal by my interpretation.    COORDINATION OF CARE: 10:29 PM-Informed pt that UA was normal, no infection noted. Discussed treatment plan which includes medications, CBC panel, CMP, lipase and UA with pt at bedside and pt agreed to plan.   00:15 feeling better, still hasn't had urine output, given another bolus and had mild nausea, given zofran and wants to try oral fluids. . States her discomfort is much better.   Results for orders placed during the hospital encounter of 09/11/12  URINALYSIS, ROUTINE W REFLEX MICROSCOPIC      Result Value Range   Color, Urine YELLOW  YELLOW   APPearance CLEAR  CLEAR   Specific Gravity, Urine 1.010  1.005 - 1.030   pH 6.5  5.0 - 8.0   Glucose, UA NEGATIVE  NEGATIVE mg/dL   Hgb urine dipstick NEGATIVE  NEGATIVE   Bilirubin Urine NEGATIVE  NEGATIVE   Ketones, ur  NEGATIVE  NEGATIVE mg/dL   Protein, ur NEGATIVE  NEGATIVE mg/dL   Urobilinogen, UA 4.0 (*) 0.0 - 1.0 mg/dL   Nitrite NEGATIVE  NEGATIVE   Leukocytes, UA SMALL (*) NEGATIVE  URINE MICROSCOPIC-ADD ON      Result Value Range   Squamous Epithelial / LPF FEW (*) RARE   WBC, UA 0-2  <3 WBC/hpf   RBC / HPF 0-2  <3 RBC/hpf   Bacteria, UA RARE  RARE  CBC WITH DIFFERENTIAL      Result Value Range   WBC 9.4  4.0 - 10.5 K/uL   RBC 5.49 (*) 3.87 - 5.11 MIL/uL   Hemoglobin 13.1  12.0 - 15.0 g/dL   HCT 53.6  64.4 - 03.4 %   MCV 72.5 (*) 78.0 - 100.0 fL   MCH 23.9 (*) 26.0 - 34.0 pg   MCHC 32.9  30.0 - 36.0 g/dL   RDW 74.2  59.5 - 63.8 %   Platelets 220  150 - 400 K/uL   Neutrophils Relative 74  43 - 77 %   Lymphocytes Relative 22  12 - 46 %   Monocytes Relative 4  3 - 12 %   Eosinophils Relative 0  0 - 5 %   Basophils Relative 0  0 - 1 %   Neutro Abs 6.9  1.7 - 7.7 K/uL   Lymphs Abs 2.1  0.7 - 4.0 K/uL   Monocytes Absolute 0.4  0.1 - 1.0 K/uL   Eosinophils Absolute 0.0  0.0 - 0.7 K/uL   Basophils Absolute 0.0  0.0 - 0.1 K/uL   Smear Review MORPHOLOGY UNREMARKABLE    COMPREHENSIVE METABOLIC PANEL      Result Value Range   Sodium 139  135 - 145 mEq/L   Potassium 3.6  3.5 - 5.1 mEq/L   Chloride 103  96 - 112 mEq/L   CO2 23  19 - 32 mEq/L   Glucose, Bld 105 (*) 70 - 99 mg/dL   BUN 5 (*) 6 - 23 mg/dL  Creatinine, Ser 0.67  0.50 - 1.10 mg/dL   Calcium 9.0  8.4 - 16.1 mg/dL   Total Protein 7.4  6.0 - 8.3 g/dL   Albumin 3.6  3.5 - 5.2 g/dL   AST 36  0 - 37 U/L   ALT 39 (*) 0 - 35 U/L   Alkaline Phosphatase 79  39 - 117 U/L   Total Bilirubin 0.7  0.3 - 1.2 mg/dL   GFR calc non Af Amer >90  >90 mL/min   GFR calc Af Amer >90  >90 mL/min  LIPASE, BLOOD      Result Value Range   Lipase 27  11 - 59 U/L   Laboratory interpretation all normal except microcytic indices in her CBC without anemia. We discussed taking iron for a few months.     1. Abdominal pain   2. Nausea and vomiting    3. GERD (gastroesophageal reflux disease)    New Prescriptions   ONDANSETRON (ZOFRAN ODT) 4 MG DISINTEGRATING TABLET    Take 1 tablet (4 mg total) by mouth every 8 (eight) hours as needed for nausea.   PANTOPRAZOLE (PROTONIX) 40 MG TABLET    Take 1 or 2 po Q 6hrs for pain    Plan discharge  Devoria Albe, MD, FACEP    MDM    I personally performed the services described in this documentation, which was scribed in my presence. The recorded information has been reviewed and considered.  Devoria Albe, MD, FACEP  Ward Givens, MD 09/12/12 [phone]

## 2012-09-12 ENCOUNTER — Telehealth (HOSPITAL_COMMUNITY): Payer: Self-pay | Admitting: Emergency Medicine

## 2012-09-12 MED ORDER — PANTOPRAZOLE SODIUM 40 MG PO TBEC: DELAYED_RELEASE_TABLET | ORAL | Status: DC

## 2012-09-12 MED ORDER — SODIUM CHLORIDE 0.9 % IV BOLUS (SEPSIS): 1000.0000 mL | Freq: Once | INTRAVENOUS | Status: DC

## 2012-09-12 MED ORDER — ONDANSETRON 4 MG PO TBDP: 4.0000 mg | ORAL_TABLET | Freq: Three times a day (TID) | ORAL | Status: DC | PRN

## 2012-09-12 MED ORDER — ONDANSETRON HCL 4 MG/2ML IJ SOLN
4.0000 mg | Freq: Once | INTRAMUSCULAR | Status: AC
  Administered 2012-09-12: 4 mg via INTRAVENOUS
  Filled 2012-09-12: qty 2

## 2012-09-12 NOTE — ED Notes (Signed)
Rx for Protonix changed from 1-2 PO q 6 hrs for pain to 1-2 PO daily.

## 2014-10-26 ENCOUNTER — Other Ambulatory Visit (HOSPITAL_COMMUNITY)
Admission: RE | Admit: 2014-10-26 | Discharge: 2014-10-26 | Disposition: A | Discharge status: Home / Self Care | Payer: BLUE CROSS/BLUE SHIELD | Source: Ambulatory Visit | Attending: Obstetrics and Gynecology | Admitting: Obstetrics and Gynecology

## 2014-10-26 ENCOUNTER — Other Ambulatory Visit: Payer: Self-pay | Admitting: Obstetrics and Gynecology

## 2014-10-26 ENCOUNTER — Other Ambulatory Visit: Payer: Self-pay

## 2014-10-26 DIAGNOSIS — Z01419 Encounter for gynecological examination (general) (routine) without abnormal findings: Secondary | ICD-10-CM | POA: Diagnosis present

## 2014-10-26 DIAGNOSIS — Z1231 Encounter for screening mammogram for malignant neoplasm of breast: Secondary | ICD-10-CM

## 2014-10-26 DIAGNOSIS — Z113 Encounter for screening for infections with a predominantly sexual mode of transmission: Secondary | ICD-10-CM | POA: Insufficient documentation

## 2014-10-28 LAB — CYTOLOGY - PAP

## 2014-11-07 ENCOUNTER — Encounter (HOSPITAL_COMMUNITY): Payer: Self-pay | Admitting: Emergency Medicine

## 2014-11-07 ENCOUNTER — Emergency Department (HOSPITAL_COMMUNITY)
Admission: EM | Admit: 2014-11-07 | Discharge: 2014-11-07 | Disposition: A | Discharge status: Home / Self Care | Payer: BLUE CROSS/BLUE SHIELD | Attending: Emergency Medicine | Admitting: Emergency Medicine

## 2014-11-07 DIAGNOSIS — Z8739 Personal history of other diseases of the musculoskeletal system and connective tissue: Secondary | ICD-10-CM | POA: Insufficient documentation

## 2014-11-07 DIAGNOSIS — Z792 Long term (current) use of antibiotics: Secondary | ICD-10-CM | POA: Insufficient documentation

## 2014-11-07 DIAGNOSIS — Z79899 Other long term (current) drug therapy: Secondary | ICD-10-CM | POA: Diagnosis not present

## 2014-11-07 DIAGNOSIS — J3489 Other specified disorders of nose and nasal sinuses: Secondary | ICD-10-CM | POA: Diagnosis not present

## 2014-11-07 DIAGNOSIS — G8929 Other chronic pain: Secondary | ICD-10-CM | POA: Insufficient documentation

## 2014-11-07 DIAGNOSIS — Z791 Long term (current) use of non-steroidal anti-inflammatories (NSAID): Secondary | ICD-10-CM | POA: Insufficient documentation

## 2014-11-07 DIAGNOSIS — R51 Headache: Secondary | ICD-10-CM | POA: Diagnosis present

## 2014-11-07 DIAGNOSIS — Z91040 Latex allergy status: Secondary | ICD-10-CM | POA: Diagnosis not present

## 2014-11-07 DIAGNOSIS — R519 Headache, unspecified: Secondary | ICD-10-CM

## 2014-11-07 HISTORY — DX: Cervicalgia: M54.2

## 2014-11-07 MED ORDER — DIPHENHYDRAMINE HCL 50 MG/ML IJ SOLN
50.0000 mg | Freq: Once | INTRAMUSCULAR | Status: AC
  Administered 2014-11-07: 50 mg via INTRAVENOUS
  Filled 2014-11-07: qty 1

## 2014-11-07 MED ORDER — LORAZEPAM 1 MG PO TABS
ORAL_TABLET | ORAL | Status: AC
  Filled 2014-11-07: qty 1

## 2014-11-07 MED ORDER — SODIUM CHLORIDE 0.9 % IV BOLUS (SEPSIS)
1000.0000 mL | Freq: Once | INTRAVENOUS | Status: AC
  Administered 2014-11-07: 1000 mL via INTRAVENOUS

## 2014-11-07 MED ORDER — METOCLOPRAMIDE HCL 10 MG PO TABS: 10.0000 mg | ORAL_TABLET | Freq: Four times a day (QID) | ORAL | Status: DC | PRN

## 2014-11-07 MED ORDER — KETOROLAC TROMETHAMINE 30 MG/ML IJ SOLN
30.0000 mg | Freq: Once | INTRAMUSCULAR | Status: AC
  Administered 2014-11-07: 30 mg via INTRAVENOUS
  Filled 2014-11-07: qty 1

## 2014-11-07 MED ORDER — LORAZEPAM 1 MG PO TABS
1.0000 mg | ORAL_TABLET | Freq: Once | ORAL | Status: AC
  Administered 2014-11-07: 1 mg via ORAL

## 2014-11-07 MED ORDER — METOCLOPRAMIDE HCL 5 MG/ML IJ SOLN
10.0000 mg | Freq: Once | INTRAMUSCULAR | Status: AC
  Administered 2014-11-07: 10 mg via INTRAVENOUS
  Filled 2014-11-07: qty 2

## 2014-11-07 NOTE — Discharge Instructions (Signed)
°Emergency Department Resource Guide °1) Find a Doctor and Pay Out of Pocket °Although you won't have to find out who is covered by your insurance plan, it is a good idea to ask around and get recommendations. You will then need to call the office and see if the doctor you have chosen will accept you as a new patient and what types of options they offer for patients who are self-pay. Some doctors offer discounts or will set up payment plans for their patients who do not have insurance, but you will need to ask so you aren't surprised when you get to your appointment. ° °2) Contact Your Local Health Department °Not all health departments have doctors that can see patients for sick visits, but many do, so it is worth a call to see if yours does. If you don't know where your local health department is, you can check in your phone book. The CDC also has a tool to help you locate your state's health department, and many state websites also have listings of all of their local health departments. ° °3) Find a Walk-in Clinic °If your illness is not likely to be very severe or complicated, you may want to try a walk in clinic. These are popping up all over the country in pharmacies, drugstores, and shopping centers. They're usually staffed by nurse practitioners or physician assistants that have been trained to treat common illnesses and complaints. They're usually fairly quick and inexpensive. However, if you have serious medical issues or chronic medical problems, these are probably not your best option. ° °No Primary Care Doctor: °- Call [facility] at  832-8000 - they can help you locate a primary care doctor that  accepts your insurance, provides certain services, etc. °- Physician Referral Service- 1-800-533-3463 ° °Chronic Pain Problems: °Organization         Address  Phone   Notes  °[facility]  (336) 297-2271 Patients need to be referred by their primary care doctor.  ° °Medication  Assistance: °Organization         Address  Phone   Notes  °[place] [facility] [address] °[place], [place] [address] (336) 641-8030 --Must be a resident of [place] °-- Must have NO insurance coverage whatsoever (no Medicaid/ Medicare, etc.) °-- The pt. MUST have a primary care doctor that directs their care regularly and follows them in the community °  °[facility]  (866) 331-1348   °[employer]  (888) 892-1162   ° °Agencies that provide inexpensive medical care: °Organization         Address  Phone   Notes  °[facility]  (336) 832-8035   °[facility]    (336) 832-7272   °[facility] [address] °[place], [place] [address] (336) 832-4777   °[facility] [address], °[place] (336) 271-4999   °[facility]    (336) 373-0678   °[facility]    (336) 272-1050   °[facility] ° [address], [place] Phone:  (336) 832-4444, Fax:  (336) 832-4440 Hours of Operation:  9 am - 6 pm, M-F.  Also accepts Medicaid/Medicare and self-pay.  °[facility] ° [address], [place] Phone: (336) 832-3150, Fax: (336) [phone]. Hours of Operation:  8:30 am - 5:30 pm, M-F.  Also accepts Medicaid and self-pay.  °[facility] [place] [address], [place] Phone: (336) 878-6027   °[facility] [address], [place], [place] (336)723-1848, Ext. 123 Mondays & Thursdays: 7-9 AM.  First 15 patients are seen on a first come, first serve basis. °  ° °Medicaid-accepting [place] Providers: ° °Organization         Address  Phone   Notes  °[facility] [address], [place] (336) 641-2100 Also accepts self-pay patients.  °[facility] [address], [place] ° (336) 856-9996   °[facility] [address], [place]  (336) 288-8857   °[facility] [address] [place] [address], [place] (336) 299-7000   °Veita Bland [address], [place]  ° (336) 373-1557 Only accepts [place] Access Medicaid patients after they have their name applied to their card.  ° °Self-Pay (no insurance) in [place]: ° °Organization         Address  Phone   Notes  °Sickle Cell Patients, [facility] [address], [place] (336) 832-1970   °[facility] [address], [place] (336) 832-4400   °[facility] ° [address], [place] (336) 992-4800   °[facility]/Dr. Osei-Bonsu ° [address] [place] [address], [place] or [address], [place] (336) 841-8500 Phone number for both [place] and [place] locations is the same.  °[facility] [address], [place] (336) 299-0000   °[facility] [address] [place] [address], [place] or [address] (336) 852-7530 °(336) 878-2260   °[facility] [address], [place] (336) 350-1642, phone; (336) 294-5005, fax Sees patients 1st and 3rd Saturday of every month.  Must not qualify for public or private insurance (i.e. Medicaid, Medicare, [place] Health Choice, Veterans' Benefits) • Household income should be no more than 200% of the poverty level •The clinic cannot treat you if you are pregnant or think you are pregnant • Sexually transmitted diseases are not treated at the clinic.  ° ° °Dental Care: °Organization         Address  Phone  Notes  °[place] [facility] [address], [place] (336) 641-6152 Accepts children up to age 21 who are enrolled in Medicaid or [place] Health Choice; pregnant women with a Medicaid card; and children who have applied for Medicaid or [place] Health Choice, but were declined, whose parents can pay a reduced fee at time of service.  °[place]  [facility] [place]  [address], [place] (336) 641-7733 Accepts children up to age 21 who are enrolled in Medicaid or [place] Health Choice; pregnant women with a Medicaid card; and children who have applied for Medicaid or [place] Health Choice, but were declined, whose parents can pay a reduced fee at time of service.  °[facility] ° [address], [place] (336) 641-4533 Patients are seen by appointment only. Walk-ins are not accepted. [facility] will see patients 18 years of age and older. °Monday - Tuesday (8am-5pm) °Most Wednesdays (8:30-5pm) °$30 per visit, cash only  °[facility] ° [address], [place] (336) 641-4533 Patients are seen by appointment only. Walk-ins are not accepted. [facility] will see patients 18 years of age and older. °One   Wednesday Evening (Monthly: Volunteer Based).  $30 per visit, cash only  °[facility]  (919) 537-3737 for adults; Children under age 4, call Graduate Pediatric Dentistry at ([phone]) [phone]. Children aged 4-14, please call ([phone]) [phone] to request a pediatric application. ° Dental services are provided in all areas of dental care including fillings, crowns and bridges, complete and partial dentures, implants, gum treatment, root canals, and extractions. Preventive care is also provided. Treatment is provided to both adults and children. °Patients are selected via a lottery and there is often a waiting list. °  °[facility] [address], °[place] ° (336) 763-8833 [email or web address] °  °[facility] [address], [place], [place] (336)723-1848, Ext. 123 Second and Fourth Thursday of each month, opens at 6:30 AM; Clinic ends at 9 AM.  Patients are seen on a first-come first-served basis, and a limited number are seen during each clinic.  ° °[facility] ° [address], [place], [place] (336) 723-7904    Eligibility Requirements °You must have lived in [place], or [place] counties for at least the last three months. °  You cannot be eligible for state or federal sponsored healthcare insurance, including Veterans Administration, Medicaid, or Medicare. °  You generally cannot be eligible for healthcare insurance through your employer.  °  How to apply: °Eligibility screenings are held every Tuesday and Wednesday afternoon from 1:00 pm until 4:00 pm. You do not need an appointment for the interview!  °[facility] [address], [place], [place] 336-631-2330   °[facility]  336-342-8273   °[facility]  336-703-3100   °[facility]  336-570-6415   ° °Behavioral Health Resources in the Community: °Intensive Outpatient Programs °Organization         Address  Phone  Notes  °[facility] [address], [place], [place] 336-878-6098   °[facility] [address], [place], [place] 336-832-9800   °[facility] [address], [place], [place] ° 336-882-2125   °[facility] [address],  °[place], [place] 1-800-853-5163 or 336-641-4981   °Substance Abuse Resources °Organization         Address  Phone  Notes  °[facility]  336-882-2125   °[facility]  336-784-9470   °[facility]  336-285-9073   °[facility]  336-845-3988   °Residential & Outpatient Substance Abuse Program  1-800-659-3381   °Psychological Services °Organization         Address  Phone  Notes  °[facility]  336- 832-9600   °[facility]  336- 378-7881   °[facility] [address], [place] 1-800-853-5163 or 336-641-4981   ° °Mobile Crisis Teams °Organization         Address  Phone  Notes  °[facility], Mobile Crisis Care Unit  1-877-626-1772   °Assertive °[facility] ° [address]  [place], [place] 336-834-9664   °Sharon DeEsch [address] °[place] 336-554-5454   ° °Self-Help/Support Groups °Organization         Address  Phone             Notes  °[facility]  - variety of support groups  336- 373-1402 Call for more information  °Narcotics Anonymous (NA), [facility] [address], °[place]  2 meetings at this location  ° °  Residential Treatment Programs Organization         Address  Phone  Notes  [facility] [address],    [place]  [phone]   [facility]  [address], [place] [phone], [place], [place] [phone]   [facility] [address], [place] [phone] Admissions: 8am-3pm M-F  [facility] [address]    [place], [place] [phone]   [facility] [address], [place], [place] [phone]   [facility] [address]  [place], [place] [phone]   [facility] Intensive Outpatient [address], [place], [place] [phone]) [phone]   [facility] [address]  [place], [place] [phone] or ([phone]   [facility] [address] [place], [place] [phone]([phone]) [phone] Accepts Medicaid  [facility] [address]  [place] [phone] Substance Abuse/Addiction Treatment   [facility]         Address  Phone  Notes  [facility]  [phone](888) [phone]   Angie FavaJulie Brannon, PhD [address] [place] [address], [address] [place], [place]   ([phone](336) [phone] or [phone](336) ([phone]) [phone]   [facility]   [address] [place], [place] [phone](336) [phone]   [facility] [address], [place], [place] [phone](336) [phone] Insurance/Medicaid/sponsorship through [facility] [address]                                    [place], [place] [phone]) [phone] Therapy/tele-psych/case    [facility] [address]   [place], [place] [phone](336) [phone]    Dr. Lolly MustacheArfeen  [phone](336) [phone]   [facility] [place] [facility]  [employer] [facility] 1) [address], [place] 2) [address], [place] 3)  [address], [place] [phone](336) [phone](336) [phone]  [phone](336) [phone]   [facility] 878-046-0820(336) (507)171-7991 or [phone](336) (347) [phone] (After Hours)      Take over the counter tylenol and ibuprofen (OR excedrin) and benadryl, as directed on packaging, with the prescription given to you today, as needed for headache.  Keep a headache diary, as discussed.  Call your regular medical doctor on Tuesday to schedule a follow up appointment within the next 3 days.  Return to the Emergency Department immediately sooner if worsening.

## 2014-11-07 NOTE — ED Notes (Signed)
Notified EDP that pt was feeling anxious after receiving IV Benadryl and that pt's hr went from mid 60's to 130"s. New orders given and carried out accordingly. Will continue to monitor.

## 2014-11-07 NOTE — ED Notes (Signed)
Patient reports that she started having a severe headache today along with nausea and vomiting. Reports symptoms started this morning.

## 2014-11-07 NOTE — ED Provider Notes (Signed)
CSN: [id number]     Arrival date & time 11/07/14  1949 History   First MD Initiated Contact with Patient 11/07/14 2110     Chief Complaint  Patient presents with  . Headache      HPI Pt was seen at 2110. Per pt, c/o gradual onset and persistence of constant acute flair of her chronic headache since this morning. Headache is located in her forehead, has been associated with N/V. Describes the headache as per her usual chronic headache pain pattern for many years. Pt states she believes her headache trigger was "being in the sun all day yesterday and not sleeping well last night." Denies headache was sudden or maximal in onset or at any time.  Denies visual changes, no focal motor weakness, no tingling/numbness in extremities, no fevers, no neck pain, no injury.      Past Medical History  Diagnosis Date  . Eating disorder   . Headache(784.0)   . Fibromyalgia   . Neck pain    Past Surgical History  Procedure Laterality Date  . Tubal ligation    . 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 Anxiety disorder Mother   . Anxiety disorder Maternal Aunt   . Depression Maternal Grandmother   . Depression Paternal Grandmother    History  Substance Use Topics  . Smoking status: Never Smoker   . Smokeless tobacco: Never Used  . Alcohol Use: No    Review of Systems ROS: Statement: All systems negative except as marked or noted in the HPI; Constitutional: Negative for fever and chills. ; ; Eyes: Negative for eye pain, redness and discharge. ; ; ENMT: Negative for ear pain, hoarseness, nasal congestion, sinus pressure and sore throat. ; ; Cardiovascular: Negative for chest pain, palpitations, diaphoresis, dyspnea and peripheral edema. ; ; Respiratory: Negative for cough, wheezing and stridor. ; ; Gastrointestinal: +N/V. Negative for diarrhea, abdominal pain, blood in stool, hematemesis, jaundice and rectal bleeding. . ; ;  Genitourinary: Negative for dysuria, flank pain and hematuria. ; ; Musculoskeletal: Negative for back pain and neck pain. Negative for swelling and trauma.; ; Skin: Negative for pruritus, abrasions, blisters, bruising and skin lesion.; ; Neuro: +headache. Negative for lightheadedness and neck stiffness. Negative for weakness, altered level of consciousness , altered mental status, extremity weakness, paresthesias, involuntary movement, seizure and syncope.      Allergies  Erythromycin; Flagyl; Latex; and Vicodin  Home Medications   Prior to Admission medications   Medication Sig Start Date End Date Taking? Authorizing Provider  ALPRAZolam Prudy Feeler) 1 MG tablet Take 1 mg by mouth daily as needed for anxiety. For anxiety   Yes Historical Provider, MD  aspirin-acetaminophen-caffeine (EXCEDRIN MIGRAINE) [phone] MG per tablet Take 2 tablets by mouth every 6 (six) hours as needed for headache.   Yes Historical Provider, MD  calcium carbonate (TUMS - DOSED IN MG ELEMENTAL CALCIUM) 500 MG chewable tablet Chew 1 tablet by mouth daily as needed for heartburn.   Yes Historical Provider, MD  cetirizine (ZYRTEC) 10 MG tablet Take 10 mg by mouth daily.   Yes Historical Provider, MD  diclofenac (VOLTAREN) 50 MG EC tablet Take 50 mg by mouth daily. 10/12/14  Yes Historical Provider, MD  famotidine (PEPCID) 40 MG tablet Take 40 mg by mouth daily.   Yes Historical Provider, MD  fish oil-omega-3 fatty acids 1000 MG capsule Take 1 g by mouth every morning.  Yes Historical Provider, MD  FLUoxetine (PROZAC) 20 MG tablet Take 60 mg by mouth at bedtime.    Yes Historical Provider, MD  Multiple Vitamin (MULTIVITAMIN WITH MINERALS) TABS tablet Take 1 tablet by mouth daily.   Yes Historical Provider, MD  ondansetron (ZOFRAN ODT) 4 MG disintegrating tablet Take 1 tablet (4 mg total) by mouth every 8 (eight) hours as needed for nausea. 09/12/12  Yes Devoria AlbeIva Knapp, MD  pantoprazole (PROTONIX) 40 MG tablet Take 1 or 2 po Q 6hrs for  pain 09/12/12   Devoria AlbeIva Knapp, MD  tinidazole (TINDAMAX) 500 MG tablet Take 500 mg by mouth 2 (two) times daily. 11/02/14   Historical Provider, MD   BP 159/84 mmHg  Pulse 67  Temp(Src) 98.1 F (36.7 C) (Oral)  Resp 22  Ht 5\' 3"  (1.6 m)  Wt 145 lb (65.772 kg)  BMI 25.69 kg/m2  SpO2 100%  LMP 10/25/2014 Physical Exam  2115: Physical examination:  Nursing notes reviewed; Vital signs and O2 SAT reviewed;  Constitutional: Well developed, Well nourished, Well hydrated, In no acute distress; Head:  Normocephalic, atraumatic; Eyes: EOMI, PERRL, No scleral icterus; ENMT: TM's clear bilat. +edemetous nasal turbinates bilat with clear rhinorrhea. Mouth and pharynx normal, Mucous membranes moist; Neck: Supple, Full range of motion, No lymphadenopathy; Cardiovascular: Regular rate and rhythm, No murmur, rub, or gallop; Respiratory: Breath sounds clear & equal bilaterally, No rales, rhonchi, wheezes.  Speaking full sentences with ease, Normal respiratory effort/excursion; Chest: Nontender, Movement normal; Abdomen: Soft, Nontender, Nondistended, Normal bowel sounds; Genitourinary: No CVA tenderness; Extremities: Pulses normal, No tenderness, No edema, No calf edema or asymmetry.; Neuro: AA&Ox3, Major CN grossly intact. No facial droop. Speech clear. No gross focal motor or sensory deficits in extremities.; Skin: Color normal, Warm, Dry.   ED Course  Procedures     EKG Interpretation None      MDM  MDM Reviewed: previous chart, nursing note and vitals Reviewed previous: CT scan     2310:  Pt "felt anxious" after receiving IV benadryl. PO ativan given with good effect. Pt states she feels better now and wants to go home. Tx symptomatically at this time. Dx d/w pt and family.  Questions answered.  Verb understanding, agreeable to d/c home with outpt f/u.    Samuel JesterKathleen Charish Schroepfer, DO 11/10/14 1046

## 2014-11-11 ENCOUNTER — Encounter (HOSPITAL_COMMUNITY): Payer: Self-pay | Admitting: Emergency Medicine

## 2014-11-11 ENCOUNTER — Emergency Department (HOSPITAL_COMMUNITY)
Admission: EM | Admit: 2014-11-11 | Discharge: 2014-11-12 | Disposition: A | Discharge status: Home / Self Care | Payer: BLUE CROSS/BLUE SHIELD | Attending: Emergency Medicine | Admitting: Emergency Medicine

## 2014-11-11 DIAGNOSIS — T364X5A Adverse effect of tetracyclines, initial encounter: Secondary | ICD-10-CM | POA: Insufficient documentation

## 2014-11-11 DIAGNOSIS — M6281 Muscle weakness (generalized): Secondary | ICD-10-CM | POA: Diagnosis not present

## 2014-11-11 DIAGNOSIS — Z7951 Long term (current) use of inhaled steroids: Secondary | ICD-10-CM | POA: Diagnosis not present

## 2014-11-11 DIAGNOSIS — Z791 Long term (current) use of non-steroidal anti-inflammatories (NSAID): Secondary | ICD-10-CM | POA: Insufficient documentation

## 2014-11-11 DIAGNOSIS — T50905A Adverse effect of unspecified drugs, medicaments and biological substances, initial encounter: Secondary | ICD-10-CM

## 2014-11-11 DIAGNOSIS — H538 Other visual disturbances: Secondary | ICD-10-CM | POA: Diagnosis not present

## 2014-11-11 DIAGNOSIS — J029 Acute pharyngitis, unspecified: Secondary | ICD-10-CM | POA: Diagnosis not present

## 2014-11-11 DIAGNOSIS — Z79899 Other long term (current) drug therapy: Secondary | ICD-10-CM | POA: Diagnosis not present

## 2014-11-11 DIAGNOSIS — R42 Dizziness and giddiness: Secondary | ICD-10-CM | POA: Diagnosis present

## 2014-11-11 DIAGNOSIS — Z91040 Latex allergy status: Secondary | ICD-10-CM | POA: Insufficient documentation

## 2014-11-11 DIAGNOSIS — R202 Paresthesia of skin: Secondary | ICD-10-CM | POA: Diagnosis not present

## 2014-11-11 DIAGNOSIS — R6883 Chills (without fever): Secondary | ICD-10-CM | POA: Insufficient documentation

## 2014-11-11 DIAGNOSIS — R112 Nausea with vomiting, unspecified: Secondary | ICD-10-CM | POA: Diagnosis not present

## 2014-11-11 DIAGNOSIS — F509 Eating disorder, unspecified: Secondary | ICD-10-CM | POA: Diagnosis not present

## 2014-11-11 DIAGNOSIS — R0602 Shortness of breath: Secondary | ICD-10-CM | POA: Diagnosis not present

## 2014-11-11 DIAGNOSIS — R55 Syncope and collapse: Secondary | ICD-10-CM

## 2014-11-11 MED ORDER — SODIUM CHLORIDE 0.9 % IV BOLUS (SEPSIS)
1000.0000 mL | Freq: Once | INTRAVENOUS | Status: AC
  Administered 2014-11-12: 1000 mL via INTRAVENOUS

## 2014-11-11 MED ORDER — ONDANSETRON HCL 4 MG/2ML IJ SOLN
4.0000 mg | Freq: Once | INTRAMUSCULAR | Status: AC
  Administered 2014-11-12: 4 mg via INTRAVENOUS
  Filled 2014-11-11: qty 2

## 2014-11-11 NOTE — ED Provider Notes (Signed)
CSN: [id number]   Arrival date & time 11/11/14 2146  History  This chart was scribed for  Gabriela Rhine, MD by Bethel Born, ED Scribe. This patient was seen in room [facility]10/[facility]10 and the patient's care was started at 11:24 PM.  Chief Complaint  Patient presents with  . Medication Reaction    The history is provided by the patient. No language interpreter was used.   Gabriela Schultz is a 49 y.o. female who presents to the Emergency Department complaining of increasing medication reaction after starting Doxycycline 3 days ago. Associated symptoms include chills, intermittent blurred vision,  Burning/itching/tingling pain in the throat, SOB, a "strange sensation" in the chest, nausea, tingling in the feet, generalized weakness and light headedness. Pt was prescribed doxycycline for a sinus infection on 11/07/14. At that time she was having a headache, post-nasal drip, abdominal pain, nausea, and vomiting. The headache has resolved and her last episode of emesis was 5 days ago. Pt denies vision loss, chest pain, diarrhea, and rash. She has been eating less than usual but is drinking normally.  Her PCP told her stop doxycycline and start keflex  Past Medical History  Diagnosis Date  . Eating disorder   . Headache(784.0)   . Fibromyalgia   . Neck pain     Past Surgical History  Procedure Laterality Date  . Tubal ligation    . 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 Anxiety disorder Mother   . Anxiety disorder Maternal Aunt   . Depression Maternal Grandmother   . Depression Paternal Grandmother     History  Substance Use Topics  . Smoking status: Never Smoker   . Smokeless tobacco: Never Used  . Alcohol Use: No     Review of Systems  Constitutional: Positive for chills. Negative for fever.  HENT: Positive for sore throat.   Eyes: Positive for blurred vision.  Respiratory: Positive for shortness of breath.  Negative for cough.   Cardiovascular: Negative for chest pain.  Gastrointestinal: Positive for nausea. Negative for vomiting and abdominal pain.  Skin: Negative for itching and rash.  Neurological: Positive for dizziness, tingling and weakness. Negative for loss of consciousness and headaches.  All other systems reviewed and are negative.   Home Medications   Prior to Admission medications   Medication Sig Start Date End Date Taking? Authorizing Provider  ALPRAZolam Prudy Feeler) 1 MG tablet Take 1 mg by mouth daily as needed for anxiety. For anxiety   Yes Historical Provider, MD  cetirizine (ZYRTEC) 10 MG tablet Take 10 mg by mouth daily.   Yes Historical Provider, MD  diclofenac (VOLTAREN) 50 MG EC tablet Take 50 mg by mouth daily. 10/12/14  Yes Historical Provider, MD  FLUoxetine (PROZAC) 20 MG tablet Take 60 mg by mouth at bedtime.    Yes Historical Provider, MD  fluticasone (FLONASE) 50 MCG/ACT nasal spray Place 2 sprays into both nostrils daily. 11/09/14  Yes Historical Provider, MD  Multiple Vitamin (MULTIVITAMIN WITH MINERALS) TABS tablet Take 1 tablet by mouth daily.   Yes Historical Provider, MD  pantoprazole (PROTONIX) 40 MG tablet Take 1 or 2 po Q 6hrs for pain Patient taking differently: Take 40 mg by mouth daily. Take 1 or 2 po Q 6hrs for pain 09/12/12  Yes Devoria Albe, MD  aspirin-acetaminophen-caffeine (EXCEDRIN MIGRAINE) [phone] MG per tablet Take 2 tablets by mouth every 6 (six) hours as needed for  headache.    Historical Provider, MD  calcium carbonate (TUMS - DOSED IN MG ELEMENTAL CALCIUM) 500 MG chewable tablet Chew 1 tablet by mouth daily as needed for heartburn.    Historical Provider, MD    Allergies  Benadryl; Doxycycline; Erythromycin; Flagyl; Latex; and Vicodin  Triage Vitals: BP 175/95 mmHg  Pulse 110  Temp(Src) 99.2 F (37.3 C) (Oral)  Resp 16  SpO2 100%  LMP 10/25/2014  Physical Exam CONSTITUTIONAL: Well developed/well nourished, anxious HEAD:  Normocephalic/atraumatic EYES: EOMI/PERRL, no nystagmus, no ptosis ENMT: Mucous membranes moist, no erythema or evidence of thrush is noted.  No angioedema is noted.  No erythema/exudates to oropharynx NECK: supple no meningeal signs, no bruits SPINE/BACK:entire spine nontender CV: S1/S2 noted, no murmurs/rubs/gallops noted LUNGS: Lungs are clear to auscultation bilaterally, no apparent distress ABDOMEN: soft, nontender, no rebound or guarding GU:no cva tenderness NEURO:Awake/alert, facies symmetric, no arm or leg drift is noted Equal 5/5 strength with shoulder abduction, elbow flex/extension, wrist flex/extension in upper extremities and equal hand grips bilaterally Equal 5/5 strength with hip flexion,knee flex/extension, foot dorsi/plantar flexion Cranial nerves 3/4/5/6/11/12/08/11/12 tested and intact Gait normal without ataxia No past pointing Sensation to light touch intact in all extremities EXTREMITIES: pulses normal, full ROM SKIN: warm, color normal PSYCH: pt is anxious   ED Course  Procedures   DIAGNOSTIC STUDIES: Oxygen Saturation is 100% on RA, normal by my interpretation.    COORDINATION OF CARE: 11:33 PM Discussed treatment plan which includes lab work, EKG, orthostatics, Zofran, and IVF with pt at bedside and pt agreed to plan.  Pt well appearing She has no neuro deficits Upon standing she felt dizzy and tingling in neck but no focal weakness noted.  She is actually hypertensive but denies h/o HTN She has no evidence CVA/MI at this time Symptoms could be explained by doxcycline and she will stop this Labs reassuring EKG reassuring She has some tachycardia but suspect there is an underlying anxiety component She has no signs of severe allergic rxn She is stable for d/c home We discussed strict ER return precautions She will stop doxycycline BP 158/97 mmHg  Pulse 103  Temp(Src) 99.2 F (37.3 C) (Oral)  Resp 14  SpO2 99%  LMP 10/25/2014   Labs Review-   Labs Reviewed  URINALYSIS, ROUTINE W REFLEX MICROSCOPIC (NOT AT [facility]) - Abnormal; Notable for the following:    Color, Urine STRAW (*)    Specific Gravity, Urine <1.005 (*)    Hgb urine dipstick TRACE (*)    All other components within normal limits  URINE MICROSCOPIC-ADD ON - Abnormal; Notable for the following:    Squamous Epithelial / LPF FEW (*)    Bacteria, UA FEW (*)    All other components within normal limits  I-STAT CHEM 8, ED - Abnormal; Notable for the following:    Glucose, Bld 122 (*)    Hemoglobin 16.3 (*)    HCT 48.0 (*)    All other components within normal limits  POC URINE PREG, ED       EKG Interpretation  Date/Time:  Friday November 12 2014 00:23:58 EDT Ventricular Rate:  95 PR Interval:  121 QRS Duration: 86 QT Interval:  371 QTC Calculation: 466 R Axis:   53 Text Interpretation:  Sinus rhythm Non-specific ST-t changes No significant change since last tracing Confirmed by Bebe ShaggyWICKLINE  MD, Tyre Beaver ([id number]) on 11/12/2014 1:04:09 AM            MDM   Final diagnoses:  Medication  reaction, initial encounter  Near syncope     Nursing notes including past medical history and social history reviewed and considered in documentation Labs/vital reviewed myself and considered during evaluation Previous records reviewed and considered  I personally performed the services described in this documentation, which was scribed in my presence. The recorded information has been reviewed and is accurate.       Gabriela Rhine, MD 11/12/14 [phone]

## 2014-11-11 NOTE — ED Notes (Signed)
Pt. Reports taking children's benadryl prior to arrival, despite allergy.

## 2014-11-11 NOTE — ED Notes (Signed)
Pt. Reports that she was started on Doxycycline on Tuesday. Pt. Reports that she has had burning on her tongue, intermittent shortness of breath, and dizzy feeling.

## 2014-11-12 LAB — URINALYSIS, ROUTINE W REFLEX MICROSCOPIC
Bilirubin Urine: NEGATIVE
Glucose, UA: NEGATIVE mg/dL
Ketones, ur: NEGATIVE mg/dL
Leukocytes, UA: NEGATIVE
Nitrite: NEGATIVE
Protein, ur: NEGATIVE mg/dL
Specific Gravity, Urine: <1.005 — ABNORMAL LOW (ref 1.005–1.030)
Urobilinogen, UA: 0.2 mg/dL (ref 0.0–1.0)
pH: 6 (ref 5.0–8.0)

## 2014-11-12 LAB — I-STAT CHEM 8, ED
BUN: 12 mg/dL (ref 6–20)
Calcium, Ion: 1.23 mmol/L (ref 1.12–1.23)
Chloride: 106 mmol/L (ref 101–111)
Creatinine, Ser: 0.7 mg/dL (ref 0.44–1.00)
Glucose, Bld: 122 mg/dL — ABNORMAL HIGH (ref 65–99)
HCT: 48 % — ABNORMAL HIGH (ref 36.0–46.0)
Hemoglobin: 16.3 g/dL — ABNORMAL HIGH (ref 12.0–15.0)
Potassium: 3.8 mmol/L (ref 3.5–5.1)
Sodium: 141 mmol/L (ref 135–145)
TCO2: 19 mmol/L (ref 0–100)

## 2014-11-12 LAB — URINE MICROSCOPIC-ADD ON

## 2014-11-12 LAB — POC URINE PREG, ED: Preg Test, Ur: NEGATIVE

## 2014-11-12 NOTE — ED Notes (Signed)
After orthostatic vitals patient states that she became very dizzy and felt strong palpations. Heart rate elevated, stated that she has some tingling in left sided neck and arm area.

## 2014-11-12 NOTE — Discharge Instructions (Signed)

## 2014-11-16 ENCOUNTER — Ambulatory Visit
Admission: RE | Admit: 2014-11-16 | Discharge: 2014-11-16 | Disposition: A | Discharge status: Home / Self Care | Payer: BLUE CROSS/BLUE SHIELD | Source: Ambulatory Visit

## 2014-11-16 DIAGNOSIS — Z1231 Encounter for screening mammogram for malignant neoplasm of breast: Secondary | ICD-10-CM

## 2014-11-17 ENCOUNTER — Ambulatory Visit: Payer: Self-pay

## 2015-04-06 ENCOUNTER — Emergency Department (HOSPITAL_COMMUNITY)
Admission: EM | Admit: 2015-04-06 | Discharge: 2015-04-06 | Disposition: A | Discharge status: Home / Self Care | Payer: BLUE CROSS/BLUE SHIELD | Attending: Emergency Medicine | Admitting: Emergency Medicine

## 2015-04-06 ENCOUNTER — Encounter (HOSPITAL_COMMUNITY): Payer: Self-pay | Admitting: *Deleted

## 2015-04-06 ENCOUNTER — Emergency Department (HOSPITAL_COMMUNITY): Payer: BLUE CROSS/BLUE SHIELD

## 2015-04-06 DIAGNOSIS — Z79899 Other long term (current) drug therapy: Secondary | ICD-10-CM | POA: Diagnosis not present

## 2015-04-06 DIAGNOSIS — Z8659 Personal history of other mental and behavioral disorders: Secondary | ICD-10-CM | POA: Diagnosis not present

## 2015-04-06 DIAGNOSIS — M797 Fibromyalgia: Secondary | ICD-10-CM | POA: Insufficient documentation

## 2015-04-06 DIAGNOSIS — I493 Ventricular premature depolarization: Secondary | ICD-10-CM

## 2015-04-06 DIAGNOSIS — Z7951 Long term (current) use of inhaled steroids: Secondary | ICD-10-CM | POA: Insufficient documentation

## 2015-04-06 DIAGNOSIS — Z791 Long term (current) use of non-steroidal anti-inflammatories (NSAID): Secondary | ICD-10-CM | POA: Diagnosis not present

## 2015-04-06 DIAGNOSIS — Z91040 Latex allergy status: Secondary | ICD-10-CM | POA: Insufficient documentation

## 2015-04-06 DIAGNOSIS — R002 Palpitations: Secondary | ICD-10-CM | POA: Diagnosis present

## 2015-04-06 LAB — BASIC METABOLIC PANEL
Anion gap: 8 (ref 5–15)
BUN: 12 mg/dL (ref 6–20)
CO2: 26 mmol/L (ref 22–32)
Calcium: 9.5 mg/dL (ref 8.9–10.3)
Chloride: 105 mmol/L (ref 101–111)
Creatinine, Ser: 0.62 mg/dL (ref 0.44–1.00)
GFR calc Af Amer: >60 mL/min (ref >60)
GFR calc non Af Amer: >60 mL/min (ref >60)
Glucose, Bld: 115 mg/dL — ABNORMAL HIGH (ref 65–99)
Potassium: 3.7 mmol/L (ref 3.5–5.1)
Sodium: 139 mmol/L (ref 135–145)

## 2015-04-06 LAB — CBC
HCT: 45 % (ref 36.0–46.0)
Hemoglobin: 14.4 g/dL (ref 12.0–15.0)
MCH: 23.8 pg — ABNORMAL LOW (ref 26.0–34.0)
MCHC: 32 g/dL (ref 30.0–36.0)
MCV: 74.4 fL — ABNORMAL LOW (ref 78.0–100.0)
Platelets: 254 10*3/uL (ref 150–400)
RBC: 6.05 MIL/uL — ABNORMAL HIGH (ref 3.87–5.11)
RDW: 14.1 % (ref 11.5–15.5)
WBC: 14.1 10*3/uL — ABNORMAL HIGH (ref 4.0–10.5)

## 2015-04-06 NOTE — Discharge Instructions (Signed)
Premature Ventricular Contraction A premature ventricular contraction is an irregularity in the normal heart rhythm. These contractions are extra heartbeats that occur too early in the normal sequence. In most cases, these contractions are harmless and do not require treatment. CAUSES Premature ventricular contractions may occur without a known cause. In healthy people, the extra contractions may be caused by:  Smoking.  Drinking alcohol.  Caffeine.  Certain medicines.  Some illegal drugs.  Stress. Sometimes, changes in chemicals in the blood (electrolytes) can also cause premature ventricular contractions. They can also occur in people with heart diseases that cause a decrease in blood flow to the heart. SIGNS AND SYMPTOMS Premature ventricular contractions often do not cause any symptoms. In some cases, you may have a feeling of your heart beating fast or skipping a beat (palpitations). DIAGNOSIS Your health care provider will take your medical history and do a physical exam. During the exam, the health care provider will check for irregular heartbeats. Various tests may be done to help diagnose premature ventricular contractions. These tests may include:  An ECG (electrocardiogram) to monitor the electrical activity of your heart.  Holter monitor testing. A Holter monitor is a portable device that can monitor the electrical activity of your heart over longer periods of time.  Stress tests to see how exercise affects your heart rhythm.  Echocardiogram. This test uses sound waves (ultrasound) to produce an image of your heart.  Electrophysiology study. This is used to evaluate the electrical conduction system of your heart. TREATMENT Usually, no treatment is needed. You may be advised to avoid things that can trigger the premature contractions, such as caffeine or alcohol. Medicines are sometimes given if symptoms are severe or if the extra heartbeats are very frequent. Treatment may  also be needed for an underlying cause of the contractions if one is found. HOME CARE INSTRUCTIONS  Take medicines only as directed by your health care provider.  Make any lifestyle changes recommended by your health care provider. These may include:  Quitting smoking.  Avoiding or limiting caffeine or alcohol.  Exercising. Talk to your health care provider about what type of exercise is safe for you.  Trying to reduce stress.  Keep all follow-up visits with your health care provider. This is important. SEEK IMMEDIATE MEDICAL CARE IF:  You feel palpitations that are frequent or continual.  You have chest pain.  You have shortness of breath.  You have sweating for no reason.  You have nausea and vomiting.  You become light-headed or faint.   This information is not intended to replace advice given to you by your health care provider. Make sure you discuss any questions you have with your health care provider.   Document Released: 12/09/2003 Document Revised: 05/14/2014 Document Reviewed: 09/24/2013 Elsevier Interactive Patient Education 2016 [employer]  

## 2015-04-06 NOTE — ED Provider Notes (Addendum)
CSN: [id number]     Arrival date & time 04/06/15  1301 History   First MD Initiated Contact with Patient 04/06/15 1517     Chief Complaint  Patient presents with  . Palpitations    HPI For the last day she had been having episodes of palpitations.  Her heart has been racing and slowing down.   It has also felt irregular like skipping beats.  When it occurs she feels fluttering in the chest.  She will start to feel lightheaded when it occurs. No CP or SOB. No vomiting or diarrhea.  Some nausea.  No new medications.   She is drinking a lot more caffeine than normal.  She saw her PCP who recommended and ECG but was unable to get it performed in the office so she was sent in for additional testing. Past Medical History  Diagnosis Date  . Eating disorder   . Headache(784.0)   . Fibromyalgia   . Neck pain    Past Surgical History  Procedure Laterality Date  . Tubal ligation    . 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 Anxiety disorder Mother   . Anxiety disorder Maternal Aunt   . Depression Maternal Grandmother   . Depression Paternal Grandmother    Social History  Substance Use Topics  . Smoking status: Never Smoker   . Smokeless tobacco: Never Used  . Alcohol Use: No   OB History    No data available     Review of Systems  All other systems reviewed and are negative.     Allergies  Benadryl; Doxycycline; Erythromycin; Flagyl; Latex; and Vicodin  Home Medications   Prior to Admission medications   Medication Sig Start Date End Date Taking? Authorizing Provider  ALPRAZolam Prudy Feeler) 1 MG tablet Take 1 mg by mouth daily as needed for anxiety. For anxiety    Historical Provider, MD  aspirin-acetaminophen-caffeine (EXCEDRIN MIGRAINE) [phone] MG per tablet Take 2 tablets by mouth every 6 (six) hours as needed for headache.    Historical Provider, MD  calcium carbonate (TUMS - DOSED IN MG ELEMENTAL  CALCIUM) 500 MG chewable tablet Chew 1 tablet by mouth daily as needed for heartburn.    Historical Provider, MD  cetirizine (ZYRTEC) 10 MG tablet Take 10 mg by mouth daily.    Historical Provider, MD  diclofenac (VOLTAREN) 50 MG EC tablet Take 50 mg by mouth daily. 10/12/14   Historical Provider, MD  FLUoxetine (PROZAC) 20 MG tablet Take 60 mg by mouth at bedtime.     Historical Provider, MD  fluticasone (FLONASE) 50 MCG/ACT nasal spray Place 2 sprays into both nostrils daily. 11/09/14   Historical Provider, MD  Multiple Vitamin (MULTIVITAMIN WITH MINERALS) TABS tablet Take 1 tablet by mouth daily.    Historical Provider, MD  pantoprazole (PROTONIX) 40 MG tablet Take 1 or 2 po Q 6hrs for pain Patient taking differently: Take 40 mg by mouth daily. Take 1 or 2 po Q 6hrs for pain 09/12/12   Devoria Albe, MD   BP 148/110 mmHg  Pulse 102  Temp(Src) 97.6 F (36.4 C) (Oral)  Resp 12  Ht 5' 3.5" (1.613 m)  Wt 68.493 kg  BMI 26.33 kg/m2  SpO2 100%  LMP  Physical Exam  Constitutional: She appears well-developed and well-nourished. No distress.  HENT:  Head: Normocephalic and atraumatic.  Right Ear: External ear normal.  Left Ear: External  ear normal.  Eyes: Conjunctivae are normal. Right eye exhibits no discharge. Left eye exhibits no discharge. No scleral icterus.  Neck: Neck supple. No tracheal deviation present. Thyromegaly present.  Cardiovascular: Normal rate, regular rhythm and intact distal pulses.   Extrasystoles are present.  Pulmonary/Chest: Effort normal and breath sounds normal. No stridor. No respiratory distress. She has no wheezes. She has no rales.  Abdominal: Soft. Bowel sounds are normal. She exhibits no distension. There is no tenderness. There is no rebound and no guarding.  Musculoskeletal: She exhibits no edema or tenderness.  Neurological: She is alert. She has normal strength. No cranial nerve deficit (no facial droop, extraocular movements intact, no slurred speech) or sensory  deficit. She exhibits normal muscle tone. She displays no seizure activity. Coordination normal.  Skin: Skin is warm and dry. No rash noted.  Psychiatric: She has a normal mood and affect.  Nursing note and vitals reviewed.   ED Course  Procedures (including critical care time) Labs Review Labs Reviewed  BASIC METABOLIC PANEL  CBC    Imaging Review Dg Chest 2 View  04/06/2015  CLINICAL DATA:  Palpitations for a few days. EXAM: CHEST  2 VIEW COMPARISON:  May 19, 2008 FINDINGS: The heart size and mediastinal contours are within normal limits. There is no focal infiltrate, pulmonary edema, or pleural effusion. The visualized skeletal structures are unremarkable. IMPRESSION: No active cardiopulmonary disease. Electronically Signed   By: Sherian ReinWei-Chen  Lin M.D.   On: 04/06/2015 14:53   I have personally reviewed and evaluated these images and lab results as part of my medical decision-making.   EKG Interpretation   Date/Time:  Wednesday April 06 2015 13:11:04 EST Ventricular Rate:  83 PR Interval:  118 QRS Duration: 84 QT Interval:  378 QTC Calculation: 444 R Axis:   15 Text Interpretation:  Normal sinus rhythm Possible Left atrial enlargement  Nonspecific ST abnormality Abnormal ECG No significant change since last  tracing Confirmed by Yamilka Lopiccolo  MD-J, Larry Knipp ([id number]) on 04/06/2015 3:18:23 PM      MDM   Final diagnoses:  PVC (premature ventricular contraction)    Patient's laboratory tests are unremarkable. EKG shows a sinus rhythm. Chest x-ray does not demonstrate any acute abnormalities.  I did notice the patient had premature ventricular contractions on the monitor while she was experiencing her symptoms. She has had increased caffeine intake recently and more stress.  Patient appears stable for discharge. I'll have her decrease her caffeine consumption. Follow-up with her primary doctor.    Linwood DibblesJon Tyona Nilsen, MD 04/06/15 1655  Added ROS and PE  Linwood DibblesJon Tonette Koehne, MD 04/21/15 [phone]

## 2015-04-06 NOTE — ED Notes (Signed)
Dr Glendell Dockerooke reviewed EKG -

## 2015-04-06 NOTE — ED Notes (Signed)
Patient sent over by  Dr. For eval of palpitations. States palpations started yesterday. Denies CP or SOB.

## 2015-05-25 ENCOUNTER — Encounter (HOSPITAL_COMMUNITY): Payer: Self-pay | Admitting: Psychiatry

## 2015-05-25 ENCOUNTER — Ambulatory Visit (INDEPENDENT_AMBULATORY_CARE_PROVIDER_SITE_OTHER): Payer: BLUE CROSS/BLUE SHIELD | Admitting: Psychiatry

## 2015-05-25 DIAGNOSIS — F419 Anxiety disorder, unspecified: Secondary | ICD-10-CM | POA: Diagnosis not present

## 2015-05-25 NOTE — Patient Instructions (Signed)
Discussed orally 
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
## 2015-06-10 ENCOUNTER — Encounter (HOSPITAL_COMMUNITY): Payer: Self-pay | Admitting: Psychiatry

## 2015-06-10 ENCOUNTER — Ambulatory Visit (INDEPENDENT_AMBULATORY_CARE_PROVIDER_SITE_OTHER): Payer: BLUE CROSS/BLUE SHIELD | Admitting: Psychiatry

## 2015-06-10 DIAGNOSIS — F419 Anxiety disorder, unspecified: Secondary | ICD-10-CM | POA: Diagnosis not present
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
## 2015-06-10 NOTE — Patient Instructions (Signed)
Discussed orally 

## 2015-06-17 ENCOUNTER — Ambulatory Visit (INDEPENDENT_AMBULATORY_CARE_PROVIDER_SITE_OTHER): Payer: BLUE CROSS/BLUE SHIELD | Admitting: Psychiatry

## 2015-06-17 ENCOUNTER — Encounter (HOSPITAL_COMMUNITY): Payer: Self-pay | Admitting: Psychiatry

## 2015-06-17 DIAGNOSIS — F419 Anxiety disorder, unspecified: Secondary | ICD-10-CM | POA: Diagnosis not present

## 2015-06-17 NOTE — Patient Instructions (Signed)
Discussed orally 

## 2015-06-17 NOTE — Progress Notes (Signed)
    THERAPIST PROGRESS NOTE  Session Time: Friday 06/17/2015 2:03 PM -  3:10 PM      Participation Level: Active  Behavioral Response: CasualAlertAnxious and Depressed  Type of Therapy: Individual Therapy  Treatment Goals addressed:   1. Learn and implement calming skills to reduce overall anxiety and manage anxiety symptoms.      2. Learn and implement personal and interpersonal skills to reduce anxiety and improve interpersonal relationships.  Interventions: Supportive  Summary: Gabriela Schultz is a 50 y.o. female who presents with a long standing history of symptoms of anxiety that have been managed well with medication. She experienced increased symptoms when she began having perimenopausal symtoms about six months ago. 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 She reports additional stress regarding change in dynamics in the relationship with her brother-in-law. She is experiencing marital stress related to poor communication as well as husband's alcoholism and controlling behavior per patient's report.   Patient reports feeling better since last session until this past Wednesday when she received call from brother-in-law indicating he doesn't want to be with wife although he is still staying with his wife. Patient now is experiencing more confusion and anxiety. She also reports her husband has been constantly calling wanting to reconcile.She says he still does not know about her affair but recently asked her if she had an affair. She expresses ambivalent feelings about telling him as she says she doesn't want to hurt his feelings. She continues to have difficulty saying no and states having difficulty making decisions even simple ones as she tends to be a people pleaser. She has made improved efforts to increase self-care  regarding exercise and eating. She still does not have a permanent place to stay and this is causing anxiety.  Suicidal/Homicidal: No  Therapist Response: Therapist works with patient to review symptoms, identify and verbalize feelings, identify strengths, identify supports, develop treatment plan, practice controlled breathing as a relaxation technique,   Plan: Return again in 1-2 weeks. Patient agrees to practice controlled breathing 5-10 minutes 2 x per day, complete breathing log, and bring to next session.  Diagnosis: Axis I: Anxiety Disorder NOS    Axis II: Deferred    Elon Eoff, LCSW 06/17/2015

## 2015-06-29 ENCOUNTER — Ambulatory Visit (INDEPENDENT_AMBULATORY_CARE_PROVIDER_SITE_OTHER): Payer: BLUE CROSS/BLUE SHIELD | Admitting: Psychiatry

## 2015-06-29 ENCOUNTER — Encounter (HOSPITAL_COMMUNITY): Payer: Self-pay | Admitting: Psychiatry

## 2015-06-29 DIAGNOSIS — F419 Anxiety disorder, unspecified: Secondary | ICD-10-CM

## 2015-06-29 NOTE — Patient Instructions (Signed)
Discussed orally 
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
## 2015-07-06 ENCOUNTER — Ambulatory Visit (HOSPITAL_COMMUNITY): Payer: Self-pay | Admitting: Psychiatry

## 2015-07-13 ENCOUNTER — Ambulatory Visit (INDEPENDENT_AMBULATORY_CARE_PROVIDER_SITE_OTHER): Payer: BLUE CROSS/BLUE SHIELD | Admitting: Psychiatry

## 2015-07-13 DIAGNOSIS — F419 Anxiety disorder, unspecified: Secondary | ICD-10-CM | POA: Diagnosis not present

## 2015-07-13 NOTE — Patient Instructions (Signed)
Discussed orally 
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
## 2015-07-14 ENCOUNTER — Ambulatory Visit (HOSPITAL_COMMUNITY): Payer: Self-pay | Admitting: Psychiatry

## 2015-07-20 ENCOUNTER — Ambulatory Visit (INDEPENDENT_AMBULATORY_CARE_PROVIDER_SITE_OTHER): Payer: BLUE CROSS/BLUE SHIELD | Admitting: Psychiatry

## 2015-07-20 DIAGNOSIS — F419 Anxiety disorder, unspecified: Secondary | ICD-10-CM
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
## 2015-07-20 NOTE — Patient Instructions (Signed)
Discussed orally 

## 2015-07-28 ENCOUNTER — Encounter (HOSPITAL_COMMUNITY): Payer: Self-pay | Admitting: Psychiatry

## 2015-07-28 ENCOUNTER — Ambulatory Visit (INDEPENDENT_AMBULATORY_CARE_PROVIDER_SITE_OTHER): Payer: BLUE CROSS/BLUE SHIELD | Admitting: Psychiatry

## 2015-07-28 DIAGNOSIS — F419 Anxiety disorder, unspecified: Secondary | ICD-10-CM | POA: Diagnosis not present

## 2015-07-28 NOTE — Patient Instructions (Signed)
Discussed orally 

## 2015-07-28 NOTE — Progress Notes (Signed)
       THERAPIST PROGRESS NOTE  Session Time:   Thursday 07/28/2015 11:17 AM -  12:00 PM          Participation Level: Active  Behavioral Response: CasualAlert/less depressed/  Type of Therapy: Individual Therapy  Treatment Goals addressed:   1. Learn and implement calming skills to reduce overall anxiety and manage anxiety symptoms.      2. Learn and implement personal and interpersonal skills to reduce anxiety and improve interpersonal relationships.  Interventions: Supportive  Summary: Foy Guadalajarannette C Mcglone is a 50 y.o. female who presents with a long standing history of symptoms of anxiety that have been managed well with medication. She experienced increased symptoms when she began having perimenopausal symtoms about six months ago. 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 She reports additional stress regarding change in dynamics in the relationship with her brother-in-law. She is experiencing marital stress related to poor communication as well as husband's alcoholism and controlling behavior per patient's report.   Patient reports continuing to feel better since last session. She has remain involved in  physical activity such as walking along with breathing exercises and meditation. She also has become more involved with her job. She has been journaling and had a session with her pastor which has been helpful. She has resumed contact with brother-in-law who no longer is involved with his wife per patient's report. She reports using assertive communication with him about her feelings and concerns. She reports being less angry. She is trying to continue to focus on taking care of self. She continues to have difficulty being assertive consistently and continues to have poor self-acceptance.  Suicidal/Homicidal:  No  Therapist Response: Therapist worked with patient to review symptoms, praise and reinforce patient's use of healthy coping skills and assertiveness skills, facilitate expression of feelings, discuss thoughts inhibiting effective assertion in various relationships, assist patient in defining assertion and assertive behavior, role play ways patient can use assertiveness skills in the relationship with her neighbor, discuss  Basic Personal Rights Handout to identify counterstatements for patient's thoughts that inhibit effective assertion  Plan: Return again in 1 week. Patient agrees to continue using healthy coping techniques discussed in session and to journal daily. She also agrees to read Basic Personal Rights handouts daily..  Diagnosis: Axis I: Anxiety Disorder NOS    Axis II: Deferred    Emersyn Wyss, LCSW 07/28/2015

## 2015-08-04 ENCOUNTER — Ambulatory Visit (INDEPENDENT_AMBULATORY_CARE_PROVIDER_SITE_OTHER): Payer: BLUE CROSS/BLUE SHIELD | Admitting: Psychiatry

## 2015-08-04 ENCOUNTER — Encounter (HOSPITAL_COMMUNITY): Payer: Self-pay | Admitting: Psychiatry

## 2015-08-04 DIAGNOSIS — F419 Anxiety disorder, unspecified: Secondary | ICD-10-CM

## 2015-08-04 NOTE — Patient Instructions (Signed)
Discussed orally 

## 2015-08-04 NOTE — Progress Notes (Signed)
       THERAPIST PROGRESS NOTE  Session Time:   Thursday 08/04/2015 2:10 PM -  3:00 PM          Participation Level: Active  Behavioral Response: CasualAlert/less depressed/  Type of Therapy: Individual Therapy  Treatment Goals addressed:   1. Learn and implement calming skills to reduce overall anxiety and manage anxiety symptoms.      2. Learn and implement personal and interpersonal skills to reduce anxiety and improve interpersonal relationships.  Interventions: Supportive  Summary: Foy Guadalajarannette C Bisher is a 50 y.o. female who presents with a long standing history of symptoms of anxiety that have been managed well with medication. She experienced increased symptoms when she began having perimenopausal symtoms about six months ago. 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 She reports additional stress regarding change in dynamics in the relationship with her brother-in-law. She is experiencing marital stress related to poor communication as well as husband's alcoholism and controlling behavior per patient's report.   Patient reports continuing to feel better since last session. She has remain involved in  physical activity such as walking along with breathing exercises and meditation. She reports decreased stress regarding estranged husband as he has stopped calling her and driving by her home. She maintains contact with her brother-in-law and cites examples of continuing to use assertiveness skills in the relationship. She also cites a recent incident with a neighbor in which she was able to use her assertiveness skills. However, she expresses frustration about recent conversation with mother who was very demanding with patient. Suicidal/Homicidal: No  Therapist Response: Therapist worked with patient to review symptoms,  praise and reinforce patient's use of healthy coping skills and assertiveness skills, facilitate expression of feelings, discuss recent involving mother and ways patient could have used assertiveness skills during that interaction, discuss specific tips regarding assertiveness skills including I messages, making requests, and saying no   Plan: Return again in 1 week. Patient agrees to continue using healthy coping techniques discussed in session and to journal daily. She also agrees to read Basic Personal Rights handouts daily and practice assertiveness techniques discussed in session.  Diagnosis: Axis I: Anxiety Disorder NOS    Axis II: Deferred    Jester Klingberg, LCSW 08/04/2015

## 2015-09-07 ENCOUNTER — Encounter (HOSPITAL_COMMUNITY): Payer: Self-pay | Admitting: Psychiatry

## 2015-09-07 ENCOUNTER — Ambulatory Visit (INDEPENDENT_AMBULATORY_CARE_PROVIDER_SITE_OTHER): Payer: BLUE CROSS/BLUE SHIELD | Admitting: Psychiatry

## 2015-09-07 DIAGNOSIS — F419 Anxiety disorder, unspecified: Secondary | ICD-10-CM | POA: Diagnosis not present

## 2015-09-07 NOTE — Progress Notes (Signed)
       THERAPIST PROGRESS NOTE  Session Time:   Wednesday 09/07/2015 10:15 AM -  11:02 AM                    Participation Level: Active  Behavioral Response: CasualAlert/less depressed/  Type of Therapy: Individual Therapy  Treatment Goals addressed:   1. Learn and implement calming skills to reduce overall anxiety and manage anxiety symptoms.      2. Learn and implement personal and interpersonal skills to reduce anxiety and improve interpersonal relationships.  Interventions: Supportive  Summary: Gabriela Schultz is a 50 y.o. female who presents with a long standing history of symptoms of anxiety that have been managed well with medication. She experienced increased symptoms when she began having perimenopausal symtoms about six months ago. 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 She reports additional stress regarding change in dynamics in the relationship with her brother-in-law. She is experiencing marital stress related to poor communication as well as husband's alcoholism and controlling behavior per patient's report.   Patient reports continuing to feel better since last session. She has maintained positive self-care and reports no longer feeling guilty about taking care of self. She has improved her use of assertiveness skills and cites recent examples involving interaction with  neighbor. She reports more support from her family and friends including her parents. She continues to experience some anxiety especially in managing adjustment regarding her new relationship and reaction from family members related to her estranged husband. Patient reports becoming more aware of early signs of panic and being unable to use self talk and controlled breathing to manage panic. She has maintain involvement in activity and is  very pleased with her job and profession. 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    Suicidal/Homicidal: No  Therapist Response:  Reviewed symptoms, facilitated expression of feelings regarding transitions and adjustments, praised and reinforced patient's use of healthy coping skills and assertiveness skills,  assisted patient to begin to explore patient's thought patterns and effects on mood and behavior  Plan: Return again in 2 weeks  Diagnosis: Axis I: Anxiety Disorder NOS    Axis II: Deferred    Christipher Rieger, LCSW 09/07/2015

## 2015-09-07 NOTE — Patient Instructions (Signed)
Discussed orally 

## 2015-09-29 ENCOUNTER — Encounter (HOSPITAL_COMMUNITY): Payer: Self-pay | Admitting: Psychiatry

## 2015-09-29 ENCOUNTER — Ambulatory Visit (INDEPENDENT_AMBULATORY_CARE_PROVIDER_SITE_OTHER): Payer: BLUE CROSS/BLUE SHIELD | Admitting: Psychiatry

## 2015-09-29 DIAGNOSIS — F419 Anxiety disorder, unspecified: Secondary | ICD-10-CM

## 2015-09-29 NOTE — Patient Instructions (Signed)
Discussed orally 

## 2015-09-29 NOTE — Progress Notes (Signed)
       THERAPIST PROGRESS NOTE  Session Time:   Thursday  09/29/2015 9:16 AM - 10:08 AM                           Participation Level: Active  Behavioral Response: CasualAlert/less depressed/  Type of Therapy: Individual Therapy  Treatment Goals addressed:   1. Learn and implement calming skills to reduce overall anxiety and manage anxiety symptoms.      2. Learn and implement personal and interpersonal skills to reduce anxiety and improve interpersonal relationships.  Interventions: Supportive  Summary: Gabriela Schultz is a 50 y.o. female who presents with a long standing history of symptoms of anxiety that have been managed well with medication. She experienced increased symptoms when she began having perimenopausal symtoms about six months ago. 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 She reports additional stress regarding change in dynamics in the relationship with her brother-in-law. She is experiencing marital stress related to poor communication as well as husband's alcoholism and controlling behavior per patient's report.   Patient reports continuing to feel better since last session. She [facility] in preparation for application for physical therapy program in the Fall 2018. 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 She remains positive about her relationship with her boyfriend. She expresses increased frustration regarding her relationship with her daughter especially their business relationship. Patient continues to have difficulty being assertive and setting boundaries with her daughter and expresses ambivalent feelings regarding their business relationship.    Suicidal/Homicidal: No  Therapist Response:  Reviewed symptoms, praise and reinforced patient's efforts to pursue her educational goals, facilitated expression of feelings, discussed the dynamics of patient's dual relationship with her daughter, assisted patient identify thoughts that inhibit and promote effective assertion in her relationship with her daughter, assigned patient homework to identify responsibilities/ her likes and dislikes in the dual relationship with her daughter, and areas patient can change  Plan: Return again in 2 weeks. Patient agrees to complete homework.  Diagnosis: Axis I: Anxiety Disorder NOS    Axis II: Deferred    Gabriela Vanvranken, LCSW 09/29/2015

## 2015-10-06 DIAGNOSIS — M5030 Other cervical disc degeneration, unspecified cervical region: Secondary | ICD-10-CM

## 2015-10-06 HISTORY — DX: Other cervical disc degeneration, unspecified cervical region: M50.30

## 2015-10-19 ENCOUNTER — Ambulatory Visit (HOSPITAL_COMMUNITY): Payer: Self-pay | Admitting: Psychiatry

## 2015-11-02 ENCOUNTER — Encounter (HOSPITAL_COMMUNITY): Payer: Self-pay | Admitting: Psychiatry

## 2015-11-02 ENCOUNTER — Ambulatory Visit (HOSPITAL_COMMUNITY): Payer: Self-pay | Admitting: Psychiatry

## 2015-11-02 ENCOUNTER — Ambulatory Visit (INDEPENDENT_AMBULATORY_CARE_PROVIDER_SITE_OTHER): Payer: BLUE CROSS/BLUE SHIELD | Admitting: Psychiatry

## 2015-11-02 DIAGNOSIS — F419 Anxiety disorder, unspecified: Secondary | ICD-10-CM

## 2015-11-02 NOTE — Patient Instructions (Signed)
Discussed orally 

## 2015-11-02 NOTE — Progress Notes (Signed)
        THERAPIST PROGRESS NOTE  Session Time:   Wednesday 11/02/2015  4:15 PM -   5:10 PM                               Participation Level: Active  Behavioral Response: CasualAlert/less depressed/  Type of Therapy: Individual Therapy  Treatment Goals addressed:   1. Learn and implement calming skills to reduce overall anxiety and manage anxiety symptoms.      2. Learn and implement personal and interpersonal skills to reduce anxiety and improve interpersonal relationships.  Interventions: Supportive  Summary: Foy Guadalajarannette C Pech is a 50 y.o. female who presents with a long standing history of symptoms of anxiety that have been managed well with medication. She experienced increased symptoms when she began having perimenopausal symtoms about six months ago. 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 She reports additional stress regarding change in dynamics in the relationship with her brother-in-law. She is experiencing marital stress related to poor communication as well as husband's alcoholism and controlling behavior per patient's report.   Patient reports increased stress and anxiety since last session. She has continued to maintain interest in involvement in activities but has struggled with use of assertiveness skills. She cites several examples involving her neighbor in which patient has had difficulty setting and maintaining boundaries. 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 Patient continues to have ambivalent feelings regarding the business relationship.  Suicidal/Homicidal: No  Therapist Response:  Reviewed symptoms, facilitated expression of feelings,  assisted  patient identify thoughts that inhibit and promote effective assertion in her relationship with her neighbor, did role play with patient to improve assertiveness skills in the relationship with her neighbor continued to discuss the dynamics in patient's dual relationship with her daughter, reviewed patient's basic personal rights to promote thoughts to facilitate effective assertion,    Plan: Return again in 2 weeks. Patient agrees to implement strategies discussed in session.  Diagnosis: Axis I: Anxiety Disorder NOS    Axis II: Deferred    Chrysten Woulfe, LCSW 11/02/2015

## 2015-11-03 ENCOUNTER — Ambulatory Visit (HOSPITAL_COMMUNITY): Payer: BLUE CROSS/BLUE SHIELD | Attending: Sports Medicine | Admitting: Physical Therapy

## 2015-11-03 DIAGNOSIS — R293 Abnormal posture: Secondary | ICD-10-CM | POA: Diagnosis present

## 2015-11-03 DIAGNOSIS — M542 Cervicalgia: Secondary | ICD-10-CM | POA: Diagnosis present

## 2015-11-03 DIAGNOSIS — R252 Cramp and spasm: Secondary | ICD-10-CM | POA: Diagnosis present

## 2015-11-03 NOTE — Patient Instructions (Signed)
   SCAPULAR RETRACTIONS  Draw your shoulder blades back and down. It should feel like you are squeezing your shoulder blades together.   Repeat 10-15 minutes, twice a day.    SHOULDER ROLLS  Move your shoulders in a circular pattern as shown so that your are moving in an up, back and down direction. Perform small cicles if needed for comfort.  Repeat 10-15 times, 2-3 times per day.    RETRACTION / CHIN TUCK  Slowly draw your head back so that your ears line up with your shoulders.  It should feel like you are tucking your chin back, like you are making a "double chin".   Repeat 10-15 times, twice a day.    Levatoer Scapulae Stretch  To stretch the left levator, turn your nose to the right and flex your head forward to bring your chin closer to your right collar bone. Take your right hand and grab the back of your head and gently pull your chin closer to your collar bone. To increase the stretch, you can hold a weight of some kind in your left hand to pull your shoulder blade down towards the floor.  Hold for 30 seconds, repeat twice each side, twice a day
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
## 2015-11-04 ENCOUNTER — Ambulatory Visit (HOSPITAL_COMMUNITY): Payer: BLUE CROSS/BLUE SHIELD

## 2015-11-04 DIAGNOSIS — M542 Cervicalgia: Secondary | ICD-10-CM | POA: Diagnosis not present

## 2015-11-04 DIAGNOSIS — R293 Abnormal posture: Secondary | ICD-10-CM

## 2015-11-04 DIAGNOSIS — R252 Cramp and spasm: Secondary | ICD-10-CM

## 2015-11-04 NOTE — Therapy (Signed)
[facility] [address] [place], [place], [address] Phone: [phone]   Fax:  [phone]  Physical Therapy Treatment  Patient Details  Name: Gabriela Schultz MRN: [id number] Date of Birth: 16-Jun-1965 Referring Provider: Albertha Gheeebecca Bassett   Encounter Date: 11/04/2015      PT End of Session - 11/04/15 1657    Visit Number 2   Number of Visits 8   Date for PT Re-Evaluation 12/01/15   Authorization Type [employer] [place] [employer]    Authorization Time Period 11/03/15 to 12/03/15   PT Start Time 1648   PT Stop Time 1734   PT Time Calculation (min) 46 min   Activity Tolerance Patient tolerated treatment well   Behavior During Therapy [facility] for tasks assessed/performed      Past Medical History  Diagnosis Date  . Eating disorder   . Headache(784.0)   . Fibromyalgia   . Neck pain   . Degenerative disc disease, cervical June 2017    Past Surgical History  Procedure Laterality Date  . Tubal ligation    . 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      Subjective Assessment - 11/04/15 1654    Subjective Pt reports Lt sided neck pain scale 3-4/10 with reports of increased pain with rotation both directions and reports of radicular symptoms last night, no reports of radicular symptoms today.   Pertinent History fibromyalgia, history of benign tumor removal    Patient Stated Goals get rid of catching sensation, be able to move better    Currently in Pain? Yes   Pain Score 4    Pain Location Neck   Pain Orientation Left   Pain Descriptors / Indicators Dull   Pain Type Chronic pain   Pain Radiating Towards none, did report radicular symptoms last night   Pain Onset More than a month ago   Pain Frequency Constant   Aggravating Factors  movement of head to side and turning   Pain Relieving Factors massage, stretching    Effect of Pain on Daily Activities has a hard  time driving              [facility] Adult PT Treatment/Exercise - 11/04/15 0001    Exercises   Exercises Neck   Neck Exercises: Seated   Neck Retraction 15 reps;3 secs   Cervical Rotation Both;10 reps   Cervical Rotation Limitations 3D neck excursion   X to V 10 reps   W Back 10 reps   Shoulder Rolls Backwards;10 reps   Postural Training Educated on importance of prroper posture with landmarks   Manual Therapy   Manual Therapy Soft tissue mobilization;Manual Traction;Passive ROM   Manual therapy comments manual complete separate rest of treatment   Neck Exercises: Stretches   Corner Stretch 2 reps;30 seconds                PT Education - 11/03/15 1759    Education provided Yes   Education Details prognosis, HEP, POC    Person(s) Educated Patient   Methods Explanation;Demonstration;Handout   Comprehension Verbalized understanding;Returned demonstration;Need further instruction          PT Short Term Goals - 11/03/15 1808    PT SHORT TERM GOAL #1   Title Patient will demonstrate correct posture 80% of the time in order to reduce pain and improve function throughout her day  Time 2   Period Weeks   Status New   PT SHORT TERM GOAL #2   Title Patient to demonstrate L cervical rotation and lateral flexion WFL in order to improve function and reduce pain    Time 2   Period Weeks   Status New   PT SHORT TERM GOAL #3   Title Patient to be independent in correctly and consistently performing appropriate HEP, to be updated PRN    Time 2   Period Weeks   Status New           PT Long Term Goals - 11/03/15 1810    PT LONG TERM GOAL #1   Title Patient to experience no more than pain 1/10 in her neck in order to improve function and QOL    Time 4   Period Weeks   Status New   PT LONG TERM GOAL #2   Title Patient to report she has been able to turn her head to drive with no exacerbation of symptoms in order to improve QOL    Time 4   Period Weeks   Status  New   PT LONG TERM GOAL #3   Title Patient to be able to complete all of her job duties with no exacerbation in pain in order to improve QOL and function    Time 4   Period Weeks   Status New               Plan - 11/04/15 1744    Clinical Impression Statement Reviewed goals, complaince with HEp and copy of eval given to pt.  Session focus on improving cervical ROM and education on importance of posture with landmarks given.  Pt able to demonstrate therex with minimal cueing required for form and technique.  Ended session with manual soft tissue mobilization, cervical traction and PROM per pt tolerance.  EOS pt demonstrated improved cervical rotation though still limited lateral flexion, reports pain reduced by one grade and demonstrates improved awareness of posture.     Rehab Potential Excellent   PT Frequency 2x / week   PT Duration 4 weeks   PT Treatment/Interventions ADLs/Self Care Home Management;Cryotherapy;Moist Heat;Functional mobility training;Therapeutic activities;Therapeutic exercise;Balance training;Neuromuscular re-education;Patient/family education;Manual techniques;Passive range of motion;Energy conservation;Taping   PT Next Visit Plan Continue current PT POC to improve cervical mobility especailly lateral flexion and rotation to the Lt. stress importance of posture strengthening with work, progress to theraband, prone and wall pushups when ready and manual to cervical musculature to reduce spasms, pain and improve ROM.     PT Home Exercise Plan reviewed, continue current HEP no additional exercises given this session.        Patient will benefit from skilled therapeutic intervention in order to improve the following deficits and impairments:  Hypomobility, Decreased strength, Pain, Increased muscle spasms, Decreased range of motion, Improper body mechanics, Decreased coordination, Impaired flexibility, Postural dysfunction  Visit Diagnosis: Cervicalgia  Abnormal  posture  Cramp and spasm     Problem List There are no active problems to display for this patient.  [address] Summerhouse LaneCasey Valyncia Wiens, LPTA; CBIS [phone]   Juel BurrowCockerham, Girtrude Enslin Jo 11/04/2015, 6:01 PM  [facility] [address] [place], [place], [address] Phone: [phone]   Fax:  [phone]  Name: Gabriela Schultz MRN: [id number] Date of Birth: 11/21/1965

## 2015-11-07 ENCOUNTER — Ambulatory Visit (HOSPITAL_COMMUNITY): Payer: BLUE CROSS/BLUE SHIELD | Admitting: Physical Therapy

## 2015-11-07 ENCOUNTER — Telehealth (HOSPITAL_COMMUNITY): Payer: Self-pay

## 2015-11-07 NOTE — Telephone Encounter (Signed)
11/07/15 cx today, said she had gotten to busy to come to therapy

## 2015-11-09 ENCOUNTER — Ambulatory Visit (HOSPITAL_COMMUNITY): Payer: BLUE CROSS/BLUE SHIELD | Attending: Sports Medicine | Admitting: Physical Therapy

## 2015-11-09 DIAGNOSIS — M542 Cervicalgia: Secondary | ICD-10-CM | POA: Insufficient documentation

## 2015-11-09 DIAGNOSIS — R252 Cramp and spasm: Secondary | ICD-10-CM | POA: Diagnosis present

## 2015-11-09 DIAGNOSIS — R293 Abnormal posture: Secondary | ICD-10-CM

## 2015-11-09 NOTE — Therapy (Signed)
[facility] [address] [place], [place], [address] Phone: [phone]   Fax:  [phone]  Physical Therapy Treatment  Patient Details  Name: Gabriela Guadalajarannette C Schultz MRN: [id number] Date of Birth: [birth year] Referring Provider: Albertha Gheeebecca Bassett   Encounter Date: 11/09/2015      PT End of Session - 11/09/15 1226    Visit Number 3   Number of Visits 8   Date for PT Re-Evaluation 12/01/15   Authorization Type [employer] [place] [employer]    Authorization Time Period 11/03/15 to 12/03/15   PT Start Time 0957   PT Stop Time 1028   PT Time Calculation (min) 31 min   Activity Tolerance Patient tolerated treatment well   Behavior During Therapy [facility] for tasks assessed/performed      Past Medical History  Diagnosis Date  . Eating disorder   . Headache(784.0)   . Fibromyalgia   . Neck pain   . Degenerative disc disease, cervical June 2017    Past Surgical History  Procedure Laterality Date  . Tubal ligation    . 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      Subjective Assessment - 11/09/15 0958    Subjective Patient arrives reporting that she is feeling better with pain 2/10 today; she was sore after moblizations and reoprts her MD has put her on a new medicine for inflammation    Pertinent History fibromyalgia, history of benign tumor removal    Currently in Pain? Yes   Pain Score 2    Pain Location Neck   Pain Orientation Left                         [facility] Adult PT Treatment/Exercise - 11/09/15 0001    Neck Exercises: Standing   Other Standing Exercises scapular retarctions 1x10 with red TB; shoulder extensions 1x10 with red TB; horizontal ABD 1x10 with red TB    Other Standing Exercises wall pushups 1x10   Neck Exercises: Seated   Neck Retraction 15 reps;3 secs   Shoulder Rolls Backwards;15 reps   Other Seated Exercise 3D cervical and thoracic  excursions 1x15    Manual Therapy   Manual Therapy Soft tissue mobilization   Manual therapy comments manual complete separate rest of treatment   Soft tissue mobilization STM upper traps and cervical extensors    Neck Exercises: Stretches   Corner Stretch 3 reps;30 seconds                PT Education - 11/09/15 1226    Education provided No          PT Short Term Goals - 11/03/15 1808    PT SHORT TERM GOAL #1   Title Patient will demonstrate correct posture 80% of the time in order to reduce pain and improve function throughout her day    Time 2   Period Weeks   Status New   PT SHORT TERM GOAL #2   Title Patient to demonstrate L cervical rotation and lateral flexion WFL in order to improve function and reduce pain    Time 2   Period Weeks   Status New   PT SHORT TERM GOAL #3   Title Patient to be independent in correctly and consistently performing appropriate HEP, to be updated PRN    Time 2   Period Weeks  Status New           PT Long Term Goals - 11/03/15 1810    PT LONG TERM GOAL #1   Title Patient to experience no more than pain 1/10 in her neck in order to improve function and QOL    Time 4   Period Weeks   Status New   PT LONG TERM GOAL #2   Title Patient to report she has been able to turn her head to drive with no exacerbation of symptoms in order to improve QOL    Time 4   Period Weeks   Status New   PT LONG TERM GOAL #3   Title Patient to be able to complete all of her job duties with no exacerbation in pain in order to improve QOL and function    Time 4   Period Weeks   Status New               Plan - 11/09/15 1226    Clinical Impression Statement Focused on functional stretching and postural strengthening today, followed by manual to affected areas of cervical spine. Patient appears to be grossly improving in terms of cervical ROM and pain, and also appears to be responding well to functional training at this time.    Rehab  Potential Excellent   PT Frequency 2x / week   PT Duration 4 weeks   PT Treatment/Interventions ADLs/Self Care Home Management;Cryotherapy;Moist Heat;Functional mobility training;Therapeutic activities;Therapeutic exercise;Balance training;Neuromuscular re-education;Patient/family education;Manual techniques;Passive range of motion;Energy conservation;Taping   PT Next Visit Plan Continue current PT POC to improve cervical mobility especailly lateral flexion and rotation to the Lt. stress importance of posture strengthening with work, progress to theraband, prone and wall pushups when ready and manual to cervical musculature to reduce spasms, pain and improve ROM.     Consulted and Agree with Plan of Care Patient      Patient will benefit from skilled therapeutic intervention in order to improve the following deficits and impairments:  Hypomobility, Decreased strength, Pain, Increased muscle spasms, Decreased range of motion, Improper body mechanics, Decreased coordination, Impaired flexibility, Postural dysfunction  Visit Diagnosis: Cervicalgia  Abnormal posture  Cramp and spasm     Problem List There are no active problems to display for this patient.   Nedra HaiKristen Lequisha Cammack PT, DPT [phone]  [facility] [address] [place], [place], [address] Phone: [phone]   Fax:  [phone]  Name: Gabriela Guadalajarannette C Schultz MRN: [id number] Date of Birth: 1966-04-01

## 2015-11-14 ENCOUNTER — Encounter (HOSPITAL_COMMUNITY): Payer: Self-pay | Admitting: Psychiatry

## 2015-11-14 ENCOUNTER — Ambulatory Visit (INDEPENDENT_AMBULATORY_CARE_PROVIDER_SITE_OTHER): Payer: BLUE CROSS/BLUE SHIELD | Admitting: Psychiatry

## 2015-11-14 DIAGNOSIS — F419 Anxiety disorder, unspecified: Secondary | ICD-10-CM | POA: Diagnosis not present

## 2015-11-14 NOTE — Progress Notes (Signed)
         THERAPIST PROGRESS NOTE  Session Time:   Monday 11/14/2015 10:16 AM -11:07 AM                              Participation Level: Active  Behavioral Response: CasualAlert/less depressed/  Type of Therapy: Individual Therapy  Treatment Goals addressed:   1. Learn and implement calming skills to reduce overall anxiety and manage anxiety symptoms.      2. Learn and implement personal and interpersonal skills to reduce anxiety and improve interpersonal relationships.  Interventions: Supportive  Summary: Gabriela Schultz is a 50 y.o. female who presents with a long standing history of symptoms of anxiety that have been managed well with medication. She experienced increased symptoms when she began having perimenopausal symtoms about six months ago. 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 She reports additional stress regarding change in dynamics in the relationship with her brother-in-law. She is experiencing marital stress related to poor communication as well as husband's alcoholism and controlling behavior per patient's report.   Patient reports increased stress and anxiety since last session. She expresses frustration regarding relationship with her boyfriend due to the dynamics involved with his estranged wife and family events. Patient expresses desire to have a break from the relationship but is fearful of hurting boyfriend's feelings. She has improved assertiveness skills in her interaction with her neighbor.  Suicidal/Homicidal: No  Therapist Response:  Reviewed symptoms, facilitated expression of feelings,  discussed patient's patterns in relationships including patterns of dependency, assisted patient identify thoughts that inhibit and promote effective assertion,   Plan: Return again in 2 weeks. Patient  agrees to review basic personal rights handout  Diagnosis: Axis I: Anxiety Disorder NOS    Axis II: Deferred    Rylea Selway, LCSW 11/14/2015

## 2015-11-14 NOTE — Patient Instructions (Signed)
Discussed orally 

## 2015-11-16 ENCOUNTER — Ambulatory Visit (HOSPITAL_COMMUNITY): Payer: BLUE CROSS/BLUE SHIELD

## 2015-11-17 ENCOUNTER — Ambulatory Visit (HOSPITAL_COMMUNITY): Payer: BLUE CROSS/BLUE SHIELD | Admitting: Physical Therapy

## 2015-11-17 DIAGNOSIS — M542 Cervicalgia: Secondary | ICD-10-CM

## 2015-11-17 DIAGNOSIS — R252 Cramp and spasm: Secondary | ICD-10-CM

## 2015-11-17 DIAGNOSIS — R293 Abnormal posture: Secondary | ICD-10-CM

## 2015-11-17 NOTE — Therapy (Signed)
[facility] [address] [place], [place], [address] Phone: [phone]   Fax:  [phone]  Physical Therapy Treatment  Patient Details  Name: Gabriela Schultz MRN: [id number] Date of Birth: [birth year] Referring Provider: Albertha Gheeebecca Bassett   Encounter Date: 11/17/2015      PT End of Session - 11/17/15 1200    Visit Number 4   Number of Visits 8   Date for PT Re-Evaluation 12/01/15   Authorization Type [employer] [place] [employer] P[employer]    Authorization Time Period 11/03/15 to 12/03/15   PT Start Time 1118   PT Stop Time 1156   PT Time Calculation (min) 38 min   Activity Tolerance Patient tolerated treatment well   Behavior During Therapy [facility] for tasks assessed/performed      Past Medical History  Diagnosis Date  . Eating disorder   . Headache(784.0)   . Fibromyalgia   . Neck pain   . Degenerative disc disease, cervical June 2017    Past Surgical History  Procedure Laterality Date  . Tubal ligation    . 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      Subjective Assessment - 11/17/15 1121    Subjective Patient arrives reporting muscle soreness after last session; she has experienced some dizziness on and off but her entire family has been experiencing this and respiratory symptoms. Hurts more to lay on her R side than her L right now.    Pertinent History fibromyalgia, history of benign tumor removal    Currently in Pain? Yes   Pain Score 1    Pain Location Neck   Pain Orientation Left   Pain Descriptors / Indicators Dull;Aching;Sharp   Pain Type Chronic pain   Pain Radiating Towards once in awhile radicular pain down U UE, not consistent    Aggravating Factors  certain movements    Pain Relieving Factors stretches, moist heat, massage    Effect of Pain on Daily Activities limited cervical ROM, reduced work tolerance                           [facility] Adult PT Treatment/Exercise - 11/17/15 0001    Neck Exercises: Standing   Other Standing Exercises scapular retarctions 1x10 with red TB; shoulder extensions 1x10 with red TB; horizontal ABD 1x10 with red TB    Neck Exercises: Seated   Neck Retraction 5 secs;15 reps   Other Seated Exercise 3D cervical and thoracic  excursions 1x15   Other Seated Exercise cervical rotation and lateral flexion with retraction 1x10   Neck Exercises: Supine   Other Supine Exercise deep neck flexor holds limited to approx 4x5 seconds due to fatigue    Other Supine Exercise cervical retrations into pillow with 5 second holds 1x15    Neck Exercises: Prone   Other Prone Exercise mad cat/old horse 1x10   Neck Exercises: Stretches   Upper Trapezius Stretch 2 reps;30 seconds   Levator Stretch 2 reps   Corner Stretch 3 reps;30 seconds                PT Education - 11/17/15 1200    Education provided No          PT Short Term Goals - 11/03/15 1808    PT SHORT TERM GOAL #1   Title Patient will demonstrate correct posture 80% of  the time in order to reduce pain and improve function throughout her day    Time 2   Period Weeks   Status New   PT SHORT TERM GOAL #2   Title Patient to demonstrate L cervical rotation and lateral flexion WFL in order to improve function and reduce pain    Time 2   Period Weeks   Status New   PT SHORT TERM GOAL #3   Title Patient to be independent in correctly and consistently performing appropriate HEP, to be updated PRN    Time 2   Period Weeks   Status New           PT Long Term Goals - 11/03/15 1810    PT LONG TERM GOAL #1   Title Patient to experience no more than pain 1/10 in her neck in order to improve function and QOL    Time 4   Period Weeks   Status New   PT LONG TERM GOAL #2   Title Patient to report she has been able to turn her head to drive with no exacerbation of symptoms in order to improve QOL    Time 4   Period Weeks   Status New    PT LONG TERM GOAL #3   Title Patient to be able to complete all of her job duties with no exacerbation in pain in order to improve QOL and function    Time 4   Period Weeks   Status New               Plan - 11/17/15 1201    Clinical Impression Statement Patient continues to have difficulty with lateral flexion, however cervical rotation has greatly improved; noted a visible catching with L lateral flexion today. Due to patient complaints of tight sensations today that go down into thoracic spine as well as distal traps, so incorporated increased focus on functional stretching today as well. Performed multiple exercises/variations of cervical retractions today, including supine and seated with rotation and lateral flexion. Patient does display signs of soft tissue tightness with movement as well as likely joint limitation reduce lateral flexion of cervical spine. Waived cervical joint mobilization today as patient reports intermittent dizziness, likely related to respiratory bug, as a precaution in order to make sure that any dizziness is not related to cervical mobilizations- will pick these back up when patient's current dizziness resolves.    Rehab Potential Excellent   PT Frequency 2x / week   PT Duration 4 weeks   PT Treatment/Interventions ADLs/Self Care Home Management;Cryotherapy;Moist Heat;Functional mobility training;Therapeutic activities;Therapeutic exercise;Balance training;Neuromuscular re-education;Patient/family education;Manual techniques;Passive range of motion;Energy conservation;Taping   PT Next Visit Plan hold off on cervical joint mobs until dizziness likely related to respiratory issue resolves; continue working on cervical mobilty, functional stretching, posture, biomechanics    Consulted and Agree with Plan of Care Patient      Patient will benefit from skilled therapeutic intervention in order to improve the following deficits and impairments:  Hypomobility,  Decreased strength, Pain, Increased muscle spasms, Decreased range of motion, Improper body mechanics, Decreased coordination, Impaired flexibility, Postural dysfunction  Visit Diagnosis: Cervicalgia  Abnormal posture  Cramp and spasm     Problem List There are no active problems to display for this patient.   Nedra Hai PT, DPT [phone]  [facility] [address] [place], [place], [address] Phone: [phone]   Fax:  [phone]  Name: Gabriela Schultz MRN: [id number] Date  of Birth: [birth year]

## 2015-11-18 ENCOUNTER — Ambulatory Visit (HOSPITAL_COMMUNITY): Payer: BLUE CROSS/BLUE SHIELD | Admitting: Physical Therapy

## 2015-11-18 DIAGNOSIS — M542 Cervicalgia: Secondary | ICD-10-CM | POA: Diagnosis not present

## 2015-11-18 DIAGNOSIS — R252 Cramp and spasm: Secondary | ICD-10-CM

## 2015-11-18 DIAGNOSIS — R293 Abnormal posture: Secondary | ICD-10-CM

## 2015-11-18 NOTE — Therapy (Signed)
[facility] [address] [place], [place], [address] Phone: [phone]   Fax:  [phone]  Physical Therapy Treatment  Patient Details  Name: Gabriela Guadalajarannette C Puskarich MRN: [id number] Date of Birth: Nov 26, 1965 Referring Provider: Albertha Gheeebecca Bassett   Encounter Date: 11/18/2015      PT End of Session - 11/18/15 1631    Visit Number 5   Number of Visits 8   Date for PT Re-Evaluation 12/01/15   Authorization Type [employer] [place] [employer] [place]    Authorization Time Period 11/03/15 to 12/03/15   PT Start Time 1437   PT Stop Time 1515   PT Time Calculation (min) 38 min   Activity Tolerance Patient tolerated treatment well   Behavior [facility] tasks assessed/performed      Past Medical History  Diagnosis Date  . Eating disorder   . Headache(784.0)   . Fibromyalgia   . Neck pain   . Degenerative disc disease, cervical June 2017    Past Surgical History  Procedure Laterality Date  . Tubal ligation    . 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      Subjective Assessment - 11/18/15 1441    Subjective Pt arrives saying she was sore last night after her session. She feels her ROM has improved but she does still feel like it just needs to pop.    Pertinent History fibromyalgia, history of benign tumor removal    Patient Stated Goals get rid of catching sensation, be able to move better    Currently in Pain? No/denies  only with cervical rotation                         [facility] Adult PT Treatment/Exercise - 11/18/15 0001    Neck Exercises: Seated   Other Seated Exercise self thoracic mobilization against chair 5x3 sec holds from T7 up to T1   Neck Exercises: Supine   Cervical Rotation Both;5 reps   Lateral Flexion Both;5 reps   Manual Therapy   Manual Therapy Soft tissue mobilization;Passive ROM;Joint mobilization   Manual therapy comments  manual complete separate rest of treatment   Joint Mobilization Grade III PA jt mobs T1-T7    Soft tissue mobilization STM Lt levator scap, cervical paraspinals, rhomboids   Passive ROM Supine: lateral flexion Lt<>Rt, cervical rotation Lt<>Rt; Lt levator/upper trap stretch 3x20 sec each   Neck Exercises: Stretches   Upper Trapezius Stretch 2 reps;30 seconds   Levator Stretch 2 reps;30 seconds   Corner Stretch 2 reps;30 seconds  with lower trap activation                PT Education - 11/18/15 1630    Education provided Yes   Education Details good probability of recovery due to relatively full cervical ROM despite limited active ROM; impact posture during work has on muscles throughout thoracic/cervical region   Starwood HotelsPerson(s) Educated Patient   Methods Explanation;Demonstration;Handout   Comprehension Verbalized understanding;Returned demonstration          PT Short Term Goals - 11/03/15 1808    PT SHORT TERM GOAL #1   Title Patient will demonstrate correct posture 80% of the time in order to reduce pain and improve function throughout her day    Time 2   Period Weeks   Status New   PT SHORT TERM GOAL #2  Title Patient to demonstrate L cervical rotation and lateral flexion WFL in order to improve function and reduce pain    Time 2   Period Weeks   Status New   PT SHORT TERM GOAL #3   Title Patient to be independent in correctly and consistently performing appropriate HEP, to be updated PRN    Time 2   Period Weeks   Status New           PT Long Term Goals - 11/03/15 1810    PT LONG TERM GOAL #1   Title Patient to experience no more than pain 1/10 in her neck in order to improve function and QOL    Time 4   Period Weeks   Status New   PT LONG TERM GOAL #2   Title Patient to report she has been able to turn her head to drive with no exacerbation of symptoms in order to improve QOL    Time 4   Period Weeks   Status New   PT LONG TERM GOAL #3   Title Patient  to be able to complete all of her job duties with no exacerbation in pain in order to improve QOL and function    Time 4   Period Weeks   Status New               Plan - 11/18/15 1632    Clinical Impression Statement Today's session further assessed cervical ROM and pt is now able to demonstrate symmetrical cervical rotation, however lateral flexion is limited actively to the Lt. Did note full passive motion to the left with additional report of pain during PROM. Original pain was reproduced with palpation along the Lt levator scap indicating possible source of her symptoms and impaired ROM. Manual techniques were performed to address thoracic mobility and soft tissue restrictions, and pt reported improved mobility, however still noting some catch with lateral flexion to the Lt. Updated HEP with levator stretch and self-thoracic mobilization to go along with manual treatment performed and pt was able to return correct demonstration with minimal verbal cues.   Rehab Potential Excellent   PT Frequency 2x / week   PT Duration 4 weeks   PT Treatment/Interventions ADLs/Self Care Home Management;Cryotherapy;Moist Heat;Functional mobility training;Therapeutic activities;Therapeutic exercise;Balance training;Neuromuscular re-education;Patient/family education;Manual techniques;Passive range of motion;Energy conservation;Taping   PT Next Visit Plan hold off on cervical joint mobs until dizziness likely related to respiratory issue resolves; continue working on cervical mobilty, functional stretching, posture, biomechanics    PT Home Exercise Plan addition of levator stretch and self thoracic mob    Consulted and Agree with Plan of Care Patient      Patient will benefit from skilled therapeutic intervention in order to improve the following deficits and impairments:  Hypomobility, Decreased strength, Pain, Increased muscle spasms, Decreased range of motion, Improper body mechanics, Decreased  coordination, Impaired flexibility, Postural dysfunction  Visit Diagnosis: Cervicalgia  Abnormal posture  Cramp and spasm     Problem List There are no active problems to display for this patient.  4:42 PM,11/18/2015 Marylyn Ishihara PT, DPT [facility] [phone]  [facility] [address] [place], [place], [address] Phone: [phone]   Fax:  [phone]  Name: LEYDA VANDERWERF MRN: [id number] Date of Birth: October 10, 1965

## 2015-11-21 ENCOUNTER — Telehealth (HOSPITAL_COMMUNITY): Payer: Self-pay | Admitting: Physical Therapy

## 2015-11-21 ENCOUNTER — Ambulatory Visit (HOSPITAL_COMMUNITY): Payer: BLUE CROSS/BLUE SHIELD | Admitting: Physical Therapy

## 2015-11-21 NOTE — Telephone Encounter (Signed)
She forgot about this apptment rescheduled for Wed with Amy

## 2015-11-23 ENCOUNTER — Ambulatory Visit (HOSPITAL_COMMUNITY): Payer: BLUE CROSS/BLUE SHIELD | Admitting: Physical Therapy

## 2015-11-23 DIAGNOSIS — M542 Cervicalgia: Secondary | ICD-10-CM | POA: Diagnosis not present

## 2015-11-23 DIAGNOSIS — R252 Cramp and spasm: Secondary | ICD-10-CM

## 2015-11-23 DIAGNOSIS — R293 Abnormal posture: Secondary | ICD-10-CM

## 2015-11-23 NOTE — Therapy (Signed)
[facility] [address] [place], [place], [address] Phone: [phone]   Fax:  [phone]  Physical Therapy Treatment  Patient Details  Name: Gabriela Schultz MRN: [id number] Date of Birth: 02-03-1966 Referring Provider: Albertha Ghee   Encounter Date: 11/23/2015      PT End of Session - 11/23/15 1732    Visit Number 6   Number of Visits 8   Date for PT Re-Evaluation 12/01/15   Authorization Type [employer] [place] [employer] P[employer]    Authorization Time Period 11/03/15 to 12/03/15   PT Start Time 1518   PT Stop Time 1600   PT Time Calculation (min) 42 min   Activity Tolerance Patient tolerated treatment well   Behavior During Therapy [facility] for tasks assessed/performed      Past Medical History  Diagnosis Date  . Eating disorder   . Headache(784.0)   . Fibromyalgia   . Neck pain   . Degenerative disc disease, cervical June 2017    Past Surgical History  Procedure Laterality Date  . Tubal ligation    . 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      Subjective Assessment - 11/23/15 1709    Subjective Pt states the manual completed last session really helped to improve her ROM and has no pain at this time.  STates she does feel some tightness in her Lt rhomboid region down into her lumbar area.    Currently in Pain? No/denies                         [facility] Adult PT Treatment/Exercise - 11/23/15 0001    Neck Exercises: Theraband   Scapula Retraction 10 reps;Red   Shoulder Extension 10 reps;Red   Rows 10 reps;Red   Neck Exercises: Standing   Other Standing Exercises body mechanics with massage   Manual Therapy   Manual Therapy Soft tissue mobilization   Manual therapy comments manual complete separate rest of treatment   Soft tissue mobilization prone lying to cervical,thoracic and upper lumbar musculature Lt>Rt                PT  Education - 11/23/15 1710    Education provided Yes   Education Details Discussed and worked on Estate manager/land agent while completing massage; given theraband and written instructions for HEP.   Person(s) Educated Patient   Methods Explanation;Demonstration;Tactile cues;Verbal cues;Handout   Comprehension Verbalized understanding;Returned demonstration          PT Short Term Goals - 11/03/15 1808    PT SHORT TERM GOAL #1   Title Patient will demonstrate correct posture 80% of the time in order to reduce pain and improve function throughout her day    Time 2   Period Weeks   Status New   PT SHORT TERM GOAL #2   Title Patient to demonstrate L cervical rotation and lateral flexion WFL in order to improve function and reduce pain    Time 2   Period Weeks   Status New   PT SHORT TERM GOAL #3   Title Patient to be independent in correctly and consistently performing appropriate HEP, to be updated PRN    Time 2   Period Weeks   Status New           PT Long Term Goals - 11/03/15 1810    PT LONG  TERM GOAL #1   Title Patient to experience no more than pain 1/10 in her neck in order to improve function and QOL    Time 4   Period Weeks   Status New   PT LONG TERM GOAL #2   Title Patient to report she has been able to turn her head to drive with no exacerbation of symptoms in order to improve QOL    Time 4   Period Weeks   Status New   PT LONG TERM GOAL #3   Title Patient to be able to complete all of her job duties with no exacerbation in pain in order to improve QOL and function    Time 4   Period Weeks   Status New               Plan - 11/23/15 1727    Clinical Impression Statement Continued with focus on improving postural strength and decreasing scap and cervical tightness.  Reviewed and discussed different ways to complete massage techniques with improved postural alignment and body mechanics.   Pt given theraband and instructions for HEP.  completed manual in prone  lying this session with focus on Lt upper trap region.  Several large knots in this area reduced but not resolved.  Pt reported overall improvment at EOS.     Rehab Potential Excellent   PT Frequency 2x / week   PT Duration 4 weeks   PT Treatment/Interventions ADLs/Self Care Home Management;Cryotherapy;Moist Heat;Functional mobility training;Therapeutic activities;Therapeutic exercise;Balance training;Neuromuscular re-education;Patient/family education;Manual techniques;Passive range of motion;Energy conservation;Taping   PT Next Visit Plan hold off on cervical joint mobs until dizziness likely related to respiratory issue resolves; continue working on cervical mobilty, functional stretching, posture, biomechanics    PT Home Exercise Plan addition of levator stretch and self thoracic mob    Consulted and Agree with Plan of Care Patient      Patient will benefit from skilled therapeutic intervention in order to improve the following deficits and impairments:  Hypomobility, Decreased strength, Pain, Increased muscle spasms, Decreased range of motion, Improper body mechanics, Decreased coordination, Impaired flexibility, Postural dysfunction  Visit Diagnosis: Cervicalgia  Abnormal posture  Cramp and spasm     Problem List There are no active problems to display for this patient.   Lurena Nidamy B Bristyn Kulesza, PTA/CLT [phone]  11/23/2015, 5:33 PM  [facility] [address] [place], [place], [address] Phone: [phone]   Fax:  [phone]  Name: Foy Guadalajarannette C Monaco MRN: [id number] Date of Birth: 1966-01-26

## 2015-11-24 ENCOUNTER — Ambulatory Visit (HOSPITAL_COMMUNITY): Payer: BLUE CROSS/BLUE SHIELD

## 2015-11-28 ENCOUNTER — Encounter (HOSPITAL_COMMUNITY): Payer: Self-pay | Admitting: Physical Therapy

## 2015-11-30 ENCOUNTER — Encounter (HOSPITAL_COMMUNITY): Payer: Self-pay | Admitting: Psychiatry

## 2015-11-30 ENCOUNTER — Ambulatory Visit (HOSPITAL_COMMUNITY): Payer: BLUE CROSS/BLUE SHIELD | Admitting: Physical Therapy

## 2015-11-30 ENCOUNTER — Ambulatory Visit (INDEPENDENT_AMBULATORY_CARE_PROVIDER_SITE_OTHER): Payer: BLUE CROSS/BLUE SHIELD | Admitting: Psychiatry

## 2015-11-30 DIAGNOSIS — R252 Cramp and spasm: Secondary | ICD-10-CM

## 2015-11-30 DIAGNOSIS — F419 Anxiety disorder, unspecified: Secondary | ICD-10-CM

## 2015-11-30 DIAGNOSIS — M542 Cervicalgia: Secondary | ICD-10-CM | POA: Diagnosis not present

## 2015-11-30 DIAGNOSIS — R293 Abnormal posture: Secondary | ICD-10-CM

## 2015-11-30 NOTE — Progress Notes (Signed)
         THERAPIST PROGRESS NOTE  Session Time:   Wednesday 11/30/2015 8:12 AM - 9:00 AM  Participation Level: Active  Behavioral Response: CasualAlert/less depressed/  Type of Therapy: Individual Therapy  Treatment Goals addressed:   1. Learn and implement calming skills to reduce overall anxiety and manage anxiety symptoms.      2. Learn and implement personal and interpersonal skills to reduce anxiety and improve interpersonal relationships.  Interventions: Supportive  Summary: SAYSHA PANDEY is a 50 y.o. female who presents with a long standing history of symptoms of anxiety that have been managed well with medication. She experienced increased symptoms when she began having perimenopausal symtoms about six months ago. 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 She reports additional stress regarding change in dynamics in the relationship with her brother-in-law. She is experiencing marital stress related to poor communication as well as husband's alcoholism and controlling behavior per patient's report.   Patient reports increased stress, anxiety, and worry since last session. Her daughter separated from her husband and she along with her two young children moved in with patient last weekend. 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 However, patient has been assertive with daughter and discussed a business/financial plan.  Suicidal/Homicidal: No  Therapist Response:  Reviewed symptoms, facilitated expression of feelings, praised and reinforced patient's use of assertiveness skills, assisted patient identify realistic expectations  during current situation and transitions, assisted patient identify ways to schedule time for self-nurture, reviewed relaxation  techniques   Plan: Return again in 2 weeks.   Diagnosis: Axis I: Anxiety Disorder NOS    Axis II: Deferred    Shahid Flori, LCSW 11/30/2015

## 2015-11-30 NOTE — Therapy (Signed)
[facility] [address] [place], [place], [address] Phone: [phone]   Fax:  [phone]  Physical Therapy Treatment (Re-Assessment)  Patient Details  Name: Gabriela Schultz MRN: [id number] Date of Birth: June 30, 1965 Referring Provider: Wandra Feinstein   Encounter Date: 11/30/2015      PT End of Session - 11/30/15 1205    Visit Number 7   Number of Visits 11   Date for PT Re-Evaluation 12/28/15   Authorization Type [employer] [place] Team Care PPO    Authorization Time Period 6/96/29 to 10/02/39; recert done 07/29/38   PT Start Time 1122   PT Stop Time 1202   PT Time Calculation (min) 40 min   Activity Tolerance Patient tolerated treatment well   Behavior During Therapy [facility] for tasks assessed/performed      Past Medical History:  Diagnosis Date  . Degenerative disc disease, cervical June 2017  . Eating disorder   . Fibromyalgia   . Headache(784.0)   . Neck pain     Past Surgical History:  Procedure Laterality Date  . 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 TUBAL LIGATION      There were no vitals filed for this visit.      Subjective Assessment - 11/30/15 1124    Subjective Patient arrives reporting she is still having some pain at night and in the mornings; she felt like she is about 75% better but she has had a rough couple of nights. Her pain is going all over, pain going into her head, arms, legs/feet with sort of a burning sensation; she reports that it almost feels like numbness but she has not noticed any differences in light touch sensation.  She cannot think of any changes in routine or physical work/technique that would have affected this. At worst, her pain can get up to 8/10 at night and she will wake up like this; once she starts moving around it does get better.    Patient is accompained by: Family member   Pertinent History fibromyalgia, history of benign tumor removal    How long can  you sit comfortably? 7/26- unlimited    How long can you stand comfortably? 7/26- 60-90 minutes and feels more weakness    How long can you walk comfortably? 7/26- unlimited    Diagnostic tests x-ray with [facility] a couple of days ago, also had MRI in the past    Patient Stated Goals get rid of catching sensation, be able to move better    Currently in Pain? Yes   Pain Score 5    Pain Location Other (Comment)  all over    Pain Orientation Other (Comment)  all over    Pain Descriptors / Indicators Burning;Tiring   Pain Type Acute pain;Chronic pain   Pain Radiating Towards all over body    Pain Onset In the past 7 days   Pain Frequency Intermittent   Aggravating Factors  unknown    Pain Relieving Factors popping in neck (R side)   Effect of Pain on Daily Activities reduced work tolerance/activity tolerance            [facility] PT Assessment - 11/30/15 0001      Assessment   Medical Diagnosis cervical neck pain    Referring Provider Wandra Feinstein    Onset Date/Surgical Date --  chronic    Next MD Visit just saw Dr. Layne Benton      Balance  Screen   Has the patient fallen in the past 6 months No   Has the patient had a decrease in activity level because of a fear of falling?  No   Is the patient reluctant to leave their home because of a fear of falling?  No     Prior Function   Level of Independence Independent;Independent with basic ADLs;Independent with gait;Independent with transfers   Vocation Full time employment   Vocation Requirements massage therapy    Leisure swimming, exercise, gardening      Observation/Other Assessments   Observations ROOS test (+) B (left greater than R) UE/had to stop  at 1 minutes 30 seconds; reflexes biceps and triceps appear WFL;  slump test (-); some vision change with active eye tracking to R just R eye; greater apparent reflex L LE vs R LE    Focus on Therapeutic Outcomes (FOTO)  43% limited      Sensation   Light Touch Impaired  Detail   Light Touch Impaired Details Impaired RUE;Absent RLE  C6-T1 R UE impaired; gross impairment R LE    Stereognosis Appears Intact   Proprioception Appears Intact   Additional Comments impaired sharp sensation C6-T1 R UE      AROM   Cervical Flexion 56   Cervical Extension 38   Cervical - Right Side Bend 47   Cervical - Left Side Bend 34   Cervical - Right Rotation 90   Cervical - Left Rotation 56     Strength   Overall Strength Comments symmetrical grip strength per finger squeeze    Right Shoulder Flexion 5/5   Right Shoulder ABduction 5/5   Right Shoulder Internal Rotation 4+/5   Right Shoulder External Rotation 4+/5   Left Shoulder Flexion 5/5   Left Shoulder ABduction 4+/5   Left Shoulder Internal Rotation 5/5   Left Shoulder External Rotation 5/5   Right Elbow Flexion 5/5   Right Elbow Extension 5/5   Left Elbow Flexion 5/5   Left Elbow Extension 5/5   Right Forearm Pronation 5/5   Right Forearm Supination 5/5   Left Forearm Pronation 5/5   Left Forearm Supination 5/5                             PT Education - 11/30/15 1205    Education provided Yes   Education Details progress with skilled PT services, POC moving forward, go to MD/ER if acute symptoms get worse just to be safe    Person(s) Educated Patient   Methods Explanation   Comprehension Verbalized understanding          PT Short Term Goals - 11/30/15 1157      PT SHORT TERM GOAL #1   Title Patient will demonstrate correct posture 80% of the time in order to reduce pain and improve function throughout her day    Baseline 7/26- ongoing    Time 2   Period Weeks   Status On-going     PT SHORT TERM GOAL #2   Title Patient to demonstrate L cervical rotation and lateral flexion WFL in order to improve function and reduce pain    Baseline 7/26- improving but needs more care    Time 2   Period Weeks   Status On-going     PT SHORT TERM GOAL #3   Title Patient to be  independent in correctly and consistently performing appropriate HEP, to be updated PRN  Baseline 7/26- compliant    Time 2   Period Weeks   Status Achieved           PT Long Term Goals - 11/30/15 1158      PT LONG TERM GOAL #1   Title Patient to experience no more than pain 1/10 in her neck in order to improve function and QOL    Baseline 7/26- was improving but back-tracked the past couple days    Time 4   Period Weeks   Status On-going     PT LONG TERM GOAL #2   Title Patient to report she has been able to turn her head to drive with no exacerbation of symptoms in order to improve QOL    Baseline 7/26- able to do no problems    Time 4   Period Weeks   Status Achieved     PT LONG TERM GOAL #3   Title Patient to be able to complete all of her job duties with no exacerbation in pain in order to improve QOL and function    Baseline 7/26- apprehension, not as much pain based    Time 4   Period Weeks   Status Partially Met               Plan - 11/30/15 1206    Clinical Impression Statement Re-assessment performed today. Patient arrived with script from MD to continue PT (asked patient to give to front desk staff for scanning), reports that she had been feeling much better but over the past couple of days hasn't slept well and has had increasing pain/feelings of burning/tingling on both sides of her body with some sensation changes in R UE. Examined cervical ROM, which has improved; patient UE strength appears WFL/WNL however did not ongoing postural weakness/postural deficits. Also performed neurological screen, with noted impaired light/sharp sensation R UE in C6-T1 dermatomes, impaired light touch grossly R LE; (+) ROOS test B and unable to continue at 1 minute, 30 seconds; reflexes appear WFL however possibly slightly less R LE and UE; slump test (-) for increase in symptoms but did produce stretching sensation; also noted vision change R eye with active tracking to the  R. Patient reports that she has been having eye muscle spasms on the R for the past couple weeks as well. Also of concern is patient's reported ongoing/increasing fatigue with work related tasks.  At this point recommend extension of skilled PT services, at 1x/week due to patient request over financial concerns, to focus on remaining cervical limitations and to monitor emergent signs/symptoms identified today; educated patient to go to ER if any symptoms should acutely worsen.    Rehab Potential Good   Clinical Impairments Affecting Rehab Potential presence of acute emergent symptoms    PT Frequency 1x / week   PT Duration 4 weeks   PT Treatment/Interventions ADLs/Self Care Home Management;Cryotherapy;Moist Heat;Functional mobility training;Therapeutic activities;Therapeutic exercise;Balance training;Neuromuscular re-education;Patient/family education;Manual techniques;Passive range of motion;Energy conservation;Taping   PT Next Visit Plan continue addressing postural strength, biomechanics, cervical limitations; monitor emergent symptoms identified at re-assessment    Consulted and Agree with Plan of Care Patient      Patient will benefit from skilled therapeutic intervention in order to improve the following deficits and impairments:  Hypomobility, Decreased strength, Pain, Increased muscle spasms, Decreased range of motion, Improper body mechanics, Decreased coordination, Impaired flexibility, Postural dysfunction  Visit Diagnosis: Cervicalgia - Plan: PT plan of care cert/re-cert  Abnormal posture - Plan: PT plan of care  cert/re-cert  Cramp and spasm - Plan: PT plan of care cert/re-cert     Problem List There are no active problems to display for this patient.   Deniece Ree PT, DPT [phone]  [facility] [address] [place], [place], [address] Phone: [phone]   Fax:  [phone]  Name: CRISTEL RAIL MRN: [id number] Date of  Birth: 10-Mar-1966

## 2015-12-06 ENCOUNTER — Ambulatory Visit (HOSPITAL_COMMUNITY): Payer: BLUE CROSS/BLUE SHIELD | Attending: Sports Medicine | Admitting: Physical Therapy

## 2015-12-06 ENCOUNTER — Telehealth (HOSPITAL_COMMUNITY): Payer: Self-pay | Admitting: Physical Therapy

## 2015-12-06 DIAGNOSIS — R293 Abnormal posture: Secondary | ICD-10-CM | POA: Insufficient documentation

## 2015-12-06 DIAGNOSIS — M542 Cervicalgia: Secondary | ICD-10-CM | POA: Insufficient documentation

## 2015-12-06 DIAGNOSIS — R252 Cramp and spasm: Secondary | ICD-10-CM | POA: Insufficient documentation

## 2015-12-06 NOTE — Telephone Encounter (Signed)
Patient a no-show for today's session. Called but it went to business voicemail so left just generic voicemail asking her to call us back for details.   Nedra Hai PT, DPT [phone]

## 2015-12-08 ENCOUNTER — Ambulatory Visit (HOSPITAL_COMMUNITY): Payer: BLUE CROSS/BLUE SHIELD

## 2015-12-08 DIAGNOSIS — R293 Abnormal posture: Secondary | ICD-10-CM | POA: Diagnosis present

## 2015-12-08 DIAGNOSIS — R252 Cramp and spasm: Secondary | ICD-10-CM

## 2015-12-08 DIAGNOSIS — M542 Cervicalgia: Secondary | ICD-10-CM | POA: Diagnosis not present

## 2015-12-08 NOTE — Therapy (Signed)
Gabriela Schultz [facility], [place], [address] Phone: [phone]   Fax:  [phone]  Physical Therapy Treatment  Patient Details  Name: [name] MRN: [id number] Date of Birth: [birth year] Referring Provider: Wandra Feinstein   Encounter Date: 12/08/2015      PT End of Session - 12/08/15 1031    Visit Number 8   Number of Visits 11   Date for PT Re-Evaluation 12/28/15   Authorization Type [employer] [place] [employer] PPO    Authorization Time Period 6/94/85 to 4/62/70; recert done 3/50/09   PT Start Time 1000  Pt late for apt   PT Stop Time 1030   PT Time Calculation (min) 30 min   Activity Tolerance Patient tolerated treatment well   Behavior During Therapy [facility] for tasks assessed/performed      Past Medical History:  Diagnosis Date  . Degenerative disc disease, cervical June 2017  . Eating disorder   . Fibromyalgia   . Headache(784.0)   . Neck pain     Past Surgical History:  Procedure Laterality Date  . 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 TUBAL LIGATION      There were no vitals filed for this visit.      Subjective Assessment - 12/08/15 1001    Subjective Pt stated she feels a lot better today, no reports of radicular symptoms and pain is minimal in center of cervical region.  Pt reports she is more aware of posture and has began yoga and reports no pain.   Pertinent History fibromyalgia, history of benign tumor removal    Patient Stated Goals get rid of catching sensation, be able to move better    Currently in Pain? Yes   Pain Score 1    Pain Location Neck   Pain Orientation Posterior;Distal  center of cervical region   Pain Descriptors / Indicators Aching   Pain Type Acute pain;Chronic pain   Pain Radiating Towards no radicular symptoms   Pain Frequency Intermittent   Aggravating Factors  unknown   Pain Relieving Factors popping in neck (Rt side)   Effect of Pain on  Daily Activities reduced work tolerance/ activity tolerance                         [facility] Adult PT Treatment/Exercise - 12/08/15 0001      Neck Exercises: Prone   Neck Retraction 10 reps;5 secs   Neck Retraction Limitations chin tuck head lift   W Back 10 reps   Shoulder Extension 15 reps   Rows Limitations 15   Other Prone Exercise horizontal abd 10x     Manual Therapy   Manual Therapy Soft tissue mobilization   Manual therapy comments manual complete separate rest of treatment   Soft tissue mobilization supine position STM to upper trap, levator scapula, scalenes, pecs, suboccipital release x 2 min     Neck Exercises: Stretches   Upper Trapezius Stretch 2 reps;30 seconds   Levator Stretch 2 reps;30 seconds   Corner Stretch 2 reps;30 seconds   Other Neck Stretches anterior, medial and posterior scalenes                  PT Short Term Goals - 11/30/15 1157      PT SHORT TERM GOAL #1   Title Patient will demonstrate correct posture 80% of the time in order to reduce pain and  improve function throughout her day    Baseline 7/26- ongoing    Time 2   Period Weeks   Status On-going     PT SHORT TERM GOAL #2   Title Patient to demonstrate L cervical rotation and lateral flexion WFL in order to improve function and reduce pain    Baseline 7/26- improving but needs more care    Time 2   Period Weeks   Status On-going     PT SHORT TERM GOAL #3   Title Patient to be independent in correctly and consistently performing appropriate HEP, to be updated PRN    Baseline 7/26- compliant    Time 2   Period Weeks   Status Achieved           PT Long Term Goals - 11/30/15 1158      PT LONG TERM GOAL #1   Title Patient to experience no more than pain 1/10 in her neck in order to improve function and QOL    Baseline 7/26- was improving but back-tracked the past couple days    Time 4   Period Weeks   Status On-going     PT LONG TERM GOAL #2   Title  Patient to report she has been able to turn her head to drive with no exacerbation of symptoms in order to improve QOL    Baseline 7/26- able to do no problems    Time 4   Period Weeks   Status Achieved     PT LONG TERM GOAL #3   Title Patient to be able to complete all of her job duties with no exacerbation in pain in order to improve QOL and function    Baseline 7/26- apprehension, not as much pain based    Time 4   Period Weeks   Status Partially Met               Plan - 12/08/15 1041    Clinical Impression Statement Pt with vast improvements with reports of radicular symptoms UE and LE resolved, pain reduced and no eye muscle spasms or pain.  Pt presents with improved awareness of proper posture at entrance.  Session focus on improving postural strengthening and functional stretches to improve cervical mobility.  Pt able to demonstrate appropraite form with min cueing for technique with cervical retraction to prevent cervical extension.  Ended session with manual technqiues to reduce tightness on posterior cervical musculature.  Encouraged pt to stay hydrated to prevent headaches following manual.   Rehab Potential Good   Clinical Impairments Affecting Rehab Potential presence of acute emergent symptoms    PT Frequency 1x / week   PT Duration 4 weeks   PT Treatment/Interventions ADLs/Self Care Home Management;Cryotherapy;Moist Heat;Functional mobility training;Therapeutic activities;Therapeutic exercise;Balance training;Neuromuscular re-education;Patient/family education;Manual techniques;Passive range of motion;Energy conservation;Taping   PT Next Visit Plan continue addressing postural strength, biomechanics, cervical limitations; monitor emergent symptoms identified at re-assessment       Patient will benefit from skilled therapeutic intervention in order to improve the following deficits and impairments:  Hypomobility, Decreased strength, Pain, Increased muscle spasms,  Decreased range of motion, Improper body mechanics, Decreased coordination, Impaired flexibility, Postural dysfunction  Visit Diagnosis: Cervicalgia  Abnormal posture  Cramp and spasm     Problem List There are no active problems to display for this patient.  [address], LPTA; [place]  Gabriela Schultz 12/08/2015, 12:55 PM  [place], [place], [address]  Phone: [phone]   Fax:  [phone]  Name: DOROTHEY OETKEN MRN: [id number] Date of Birth: 1966/02/01

## 2015-12-13 ENCOUNTER — Encounter (HOSPITAL_COMMUNITY): Payer: Self-pay

## 2015-12-15 ENCOUNTER — Encounter (HOSPITAL_COMMUNITY): Payer: Self-pay

## 2015-12-19 ENCOUNTER — Ambulatory Visit (INDEPENDENT_AMBULATORY_CARE_PROVIDER_SITE_OTHER): Payer: BLUE CROSS/BLUE SHIELD | Admitting: Psychiatry

## 2015-12-19 ENCOUNTER — Telehealth (HOSPITAL_COMMUNITY): Payer: Self-pay

## 2015-12-19 ENCOUNTER — Encounter (HOSPITAL_COMMUNITY): Payer: Self-pay | Admitting: Psychiatry

## 2015-12-19 DIAGNOSIS — F419 Anxiety disorder, unspecified: Secondary | ICD-10-CM | POA: Diagnosis not present

## 2015-12-19 NOTE — Telephone Encounter (Signed)
12/19/15 pt cx appt but no reason was given

## 2015-12-19 NOTE — Progress Notes (Signed)
         THERAPIST PROGRESS NOTE  Session Time:   Monday 12/19/2015 8:15 -8:43 AM  Participation Level: Active  Behavioral Response: CasualAlert/less depressed/  Type of Therapy: Individual Therapy  Treatment Goals addressed:   1. Learn and implement calming skills to reduce overall anxiety and manage anxiety symptoms.      2. Learn and implement personal and interpersonal skills to reduce anxiety and improve interpersonal relationships.  Interventions: Supportive  Summary: Gabriela Schultz is a 50 y.o. female who presents with a long standing history of symptoms of anxiety that have been managed well with medication. She experienced increased symptoms when she began having perimenopausal symtoms about six months ago. 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 She reports additional stress regarding change in dynamics in the relationship with her brother-in-law. She is experiencing marital stress related to poor communication as well as husband's alcoholism and controlling behavior per patient's report.   Patient reports continued stress since last session. Her daughter has reconciled with her husband and has moved back to her home. Patient remains concerned about daughter but is respecting daughter's boundaries. Patient reports enjoying recent trip with boyfriend but experiencing sadness, anxiety, and guilt once they returned home. His children had an event to which he was invited but she was not. She expresses ambivalent feelings about relationship with boyfriend  due to continued family dynamics and her spiritual beliefs. She reports telling boyfriend she needs space for right now.   Suicidal/Homicidal: No  Therapist Response:  Reviewed symptoms, facilitated expression of feelings, assisted patient examine her patterns  in relationships, assisted patient identify realistic expectations during current situation and transitions, assisted patient identify her priorities, encouraged patient to journal  Plan: Return again in 2 weeks.   Diagnosis: Axis I: Anxiety Disorder NOS    Axis II: Deferred    Tifany Hirsch, LCSW 12/19/2015

## 2015-12-20 ENCOUNTER — Encounter (HOSPITAL_COMMUNITY): Payer: Self-pay | Admitting: Physical Therapy

## 2015-12-22 ENCOUNTER — Ambulatory Visit (HOSPITAL_COMMUNITY): Payer: BLUE CROSS/BLUE SHIELD | Admitting: Physical Therapy

## 2015-12-22 ENCOUNTER — Telehealth (HOSPITAL_COMMUNITY): Payer: Self-pay

## 2015-12-22 DIAGNOSIS — R293 Abnormal posture: Secondary | ICD-10-CM

## 2015-12-22 DIAGNOSIS — R252 Cramp and spasm: Secondary | ICD-10-CM

## 2015-12-22 DIAGNOSIS — M542 Cervicalgia: Secondary | ICD-10-CM

## 2015-12-22 NOTE — Therapy (Signed)
[place], [place], [address] Phone: [phone]   Fax:  [phone]  Physical Therapy Treatment  Patient Details  Name: Gabriela Schultz MRN: [id number] Date of Birth: 07-Nov-1965 Referring Provider: Wandra Feinstein   Encounter Date: 12/22/2015      PT End of Session - 12/22/15 0925    Visit Number 9   Number of Visits 11   Date for PT Re-Evaluation 12/28/15   Authorization Type [employer] [place] [employer] PPO    Authorization Time Period cert 6/23-7/62   PT Start Time 0915   PT Stop Time 0945   PT Time Calculation (min) 30 min   Activity Tolerance Patient tolerated treatment well   Behavior During Therapy [facility] for tasks assessed/performed      Past Medical History:  Diagnosis Date  . Degenerative disc disease, cervical June 2017  . Eating disorder   . Fibromyalgia   . Headache(784.0)   . Neck pain     Past Surgical History:  Procedure Laterality Date  . 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 TUBAL LIGATION      There were no vitals filed for this visit.      Subjective Assessment - 12/22/15 0954    Subjective No radicular symptoms and independent with HEP.  States she is more aware of her posture and really watching her body mechanics.  Overall doing great.   Currently in Pain? No/denies                         [facility] Adult PT Treatment/Exercise - 12/22/15 0001      Neck Exercises: Prone   Neck Retraction 10 reps;5 secs   Neck Retraction Limitations chin tuck head lift   W Back 15 reps   Shoulder Extension 15 reps   Rows Limitations 15 reps   Other Prone Exercise horizontal abd 15x     Manual Therapy   Manual Therapy Soft tissue mobilization   Manual therapy comments manual complete separate rest of treatment   Soft tissue mobilization prone lying     Neck Exercises: Stretches   Upper Trapezius Stretch 2 reps;30 seconds   Levator Stretch 2  reps;30 seconds                  PT Short Term Goals - 11/30/15 1157      PT SHORT TERM GOAL #1   Title Patient will demonstrate correct posture 80% of the time in order to reduce pain and improve function throughout her day    Baseline 7/26- ongoing    Time 2   Period Weeks   Status On-going     PT SHORT TERM GOAL #2   Title Patient to demonstrate L cervical rotation and lateral flexion WFL in order to improve function and reduce pain    Baseline 7/26- improving but needs more care    Time 2   Period Weeks   Status On-going     PT SHORT TERM GOAL #3   Title Patient to be independent in correctly and consistently performing appropriate HEP, to be updated PRN    Baseline 7/26- compliant    Time 2   Period Weeks   Status Achieved           PT Long Term Goals - 11/30/15 1158      PT LONG TERM GOAL #1   Title Patient to  experience no more than pain 1/10 in her neck in order to improve function and QOL    Baseline 7/26- was improving but back-tracked the past couple days    Time 4   Period Weeks   Status On-going     PT LONG TERM GOAL #2   Title Patient to report she has been able to turn her head to drive with no exacerbation of symptoms in order to improve QOL    Baseline 7/26- able to do no problems    Time 4   Period Weeks   Status Achieved     PT LONG TERM GOAL #3   Title Patient to be able to complete all of her job duties with no exacerbation in pain in order to improve QOL and function    Baseline 7/26- apprehension, not as much pain based    Time 4   Period Weeks   Status Partially Met               Plan - 12/22/15 0925    Clinical Impression Statement Vast improvments with only occasional pain down Rt side of neck that subsides quickly. Pt is completing her HEP and continues to attend [employer] regularly.  Continued with manual techniques with no tightness or spasms palpated this session.  Anticipate discharge soon.   Rehab Potential Good    Clinical Impairments Affecting Rehab Potential presence of acute emergent symptoms    PT Frequency 1x / week   PT Duration 4 weeks   PT Treatment/Interventions ADLs/Self Care Home Management;Cryotherapy;Moist Heat;Functional mobility training;Therapeutic activities;Therapeutic exercise;Balance training;Neuromuscular re-education;Patient/family education;Manual techniques;Passive range of motion;Energy conservation;Taping   PT Next Visit Plan Re-evaluate next session.      Patient will benefit from skilled therapeutic intervention in order to improve the following deficits and impairments:  Hypomobility, Decreased strength, Pain, Increased muscle spasms, Decreased range of motion, Improper body mechanics, Decreased coordination, Impaired flexibility, Postural dysfunction  Visit Diagnosis: Cervicalgia  Abnormal posture  Cramp and spasm     Problem List There are no active problems to display for this patient.   [name], PTA/CLT [phone] 12/22/2015, 9:57 AM  [place] [address] [place], [place], [address] Phone: [phone]   Fax:  [phone]  Name: Gabriela Schultz MRN: [id number] Date of Birth: 1965/09/30

## 2015-12-27 ENCOUNTER — Encounter (HOSPITAL_COMMUNITY): Payer: Self-pay | Admitting: Physical Therapy

## 2015-12-29 ENCOUNTER — Ambulatory Visit (HOSPITAL_COMMUNITY): Payer: BLUE CROSS/BLUE SHIELD | Admitting: Physical Therapy

## 2015-12-29 DIAGNOSIS — M542 Cervicalgia: Secondary | ICD-10-CM | POA: Diagnosis not present

## 2015-12-29 DIAGNOSIS — R252 Cramp and spasm: Secondary | ICD-10-CM

## 2015-12-29 DIAGNOSIS — R293 Abnormal posture: Secondary | ICD-10-CM

## 2015-12-29 NOTE — Therapy (Signed)
[place] [address] [place], [place], [address] Phone: [phone]   Fax:  [phone]  Physical Therapy Treatment (Discharge)  Patient Details  Name: Gabriela Schultz MRN: [id number] Date of Birth: 1965-06-17 Referring Provider: Wandra Feinstein   Encounter Date: 12/29/2015      PT End of Session - 12/29/15 0936    Visit Number 10   Number of Visits 10   Authorization Type [employer] [place] PPO    Authorization Time Period cert 5/17-0/01   PT Start Time [phone]  patient a few minutes late    PT Stop Time 0932  no further skilled PT services needed, DC today    PT Time Calculation (min) 26 min   Activity Tolerance Patient tolerated treatment well   Behavior During Therapy [facility] for tasks assessed/performed      Past Medical History:  Diagnosis Date  . Degenerative disc disease, cervical June 2017  . Eating disorder   . Fibromyalgia   . Headache(784.0)   . Neck pain     Past Surgical History:  Procedure Laterality Date  . 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 TUBAL LIGATION      There were no vitals filed for this visit.      Subjective Assessment - 12/29/15 0908    Subjective Patient arrives today stating that she is feeling better, she has been having more stomach problems than neck problems recently. The R side of her neck is more achey now. She was doing some personal training on Tuesday, doing some floor work and ended irritating her neck but was able to calm it down with some foam rolling and stretches. Patient reoprts she can do everything she needs and wants at this time. No re-occurrence of radicular/whole body pain symptoms since last re-assessment.  Patient does have some dizziness with rotation to R but she has had some sinus issues that could be contributing.   Patient is accompained by: Family member   Pertinent History fibromyalgia, history of benign tumor removal    How long  can you sit comfortably? 8/24- unlimited    How long can you stand comfortably? 8/24- feels fairly unlimited    How long can you walk comfortably? 8/24 unlimited    Diagnostic tests x-ray with [facility] a couple of days ago, also had MRI in the past    Patient Stated Goals get rid of catching sensation, be able to move better    Currently in Pain? Yes   Pain Score 1    Pain Location Neck   Pain Orientation Right   Pain Descriptors / Indicators Aching   Pain Type Chronic pain   Pain Radiating Towards no radicular symptoms    Pain Onset In the past 7 days   Pain Frequency Intermittent   Aggravating Factors  none    Pain Relieving Factors stretches and foam roller work    Effect of Pain on Daily Activities none             [facility] PT Assessment - 12/29/15 0001      Observation/Other Assessments   Observations deep neck flexor endurance 45 seconds before failure   only very mild scapular dysfunction with UE motions overhead   Focus on Therapeutic Outcomes (FOTO)  18% limited      AROM   Cervical Flexion 58   Cervical Extension 40   Cervical - Right Side Bend 44  Cervical - Left Side Bend 46   Cervical - Right Rotation 90   Cervical - Left Rotation 78     Strength   Right Shoulder Flexion 5/5   Right Shoulder ABduction 5/5   Right Shoulder Internal Rotation 5/5   Right Shoulder External Rotation 5/5   Left Shoulder Flexion 5/5   Left Shoulder ABduction 5/5   Left Shoulder Internal Rotation 5/5   Left Shoulder External Rotation 5/5   Cervical Flexion 5/5   Cervical Extension 5/5   Cervical - Right Side Bend 5/5   Cervical - Left Side Bend 5/5     Palpation   Palpation comment mild muscle tension/spasm noted scapular stabilizers/upper traps/cervical extensors                              PT Education - 12/29/15 0935    Education provided No   Education Details progress with skilled PT services, DC today, discussed HEP/stretching/massage     Person(s) Educated Patient   Methods Explanation   Comprehension Verbalized understanding          PT Short Term Goals - 12/29/15 0926      PT SHORT TERM GOAL #1   Title Patient will demonstrate correct posture 80% of the time in order to reduce pain and improve function throughout her day    Baseline 8/24- improving, some impairment remains    Time 2   Period Weeks   Status Partially Met     PT SHORT TERM GOAL #2   Title Patient to demonstrate L cervical rotation and lateral flexion WFL in order to improve function and reduce pain    Baseline 8/24- mild limitation but completely functional and pain free    Time 2   Period Weeks   Status Partially Met     PT SHORT TERM GOAL #3   Title Patient to be independent in correctly and consistently performing appropriate HEP, to be updated PRN    Baseline 8/24- reports compliance    Time 2   Period Weeks   Status Achieved           PT Long Term Goals - 12/29/15 8889      PT LONG TERM GOAL #1   Title Patient to experience no more than pain 1/10 in her neck in order to improve function and QOL    Baseline 8/24- has not gotten over 1-2/10   Time 4   Period Weeks   Status Partially Met     PT LONG TERM GOAL #2   Title Patient to report she has been able to turn her head to drive with no exacerbation of symptoms in order to improve QOL    Time 4   Period Weeks   Status Achieved     PT LONG TERM GOAL #3   Title Patient to be able to complete all of her job duties with no exacerbation in pain in order to improve QOL and function    Baseline 8/24- reports success with goal    Time 4   Period Weeks   Status Achieved               Plan - 12/29/15 0937    Clinical Impression Statement Re-assessment performed today. Patient reports that she is happy with her progress, states that she has been able to do all that she needs and wants with no pain or problems; she does have some  intermittent aching and stiffness, but she  has been addressing and managing this via her HEP and getting massages done. Upon examination, patient does continue to display some mild cervical stiffness, postural limitations, and mild soft tissue tightness however none of this appears to be limiting her functionally and she does not experience increased pain with functional task performance. Reviewed HEP, which seems very comprehensive and appropriate at this time, and also advised patient to continue working on rotating L to/holding neck in end range to assist in stretching soft tissues and reducing remaining ROM limitations. DC today due to high level of function and patient satisfaction at this time.    Rehab Potential Good   PT Next Visit Plan DC today    Consulted and Agree with Plan of Care Patient      Patient will benefit from skilled therapeutic intervention in order to improve the following deficits and impairments:  Hypomobility, Decreased strength, Pain, Increased muscle spasms, Decreased range of motion, Improper body mechanics, Decreased coordination, Impaired flexibility, Postural dysfunction  Visit Diagnosis: Cervicalgia  Abnormal posture  Cramp and spasm     Problem List There are no active problems to display for this patient.   PHYSICAL THERAPY DISCHARGE SUMMARY  Visits from Start of Care: 10  Current functional level related to goals / functional outcomes: Patient reports she feels she has improved quite a bit and is happy with her current functional status.    Remaining deficits: Mild cervical stiffness and occasional pain, impaired posture    Education / Equipment: Continue moving neck into end range positions to improve ROM, continue with current HEP; DC today  Plan: Patient agrees to discharge.  Patient goals were met. Patient is being discharged due to being pleased with the current functional level.  ?????       Deniece Ree PT, DPT [place] [address] [place], [place], [address] Phone: [phone]   Fax:  [phone]  Name: Gabriela Schultz MRN: [id number] Date of Birth: 02/15/1966

## 2016-01-02 ENCOUNTER — Ambulatory Visit (HOSPITAL_COMMUNITY): Payer: Self-pay | Admitting: Psychiatry

## 2016-01-03 ENCOUNTER — Ambulatory Visit (INDEPENDENT_AMBULATORY_CARE_PROVIDER_SITE_OTHER): Payer: BLUE CROSS/BLUE SHIELD | Admitting: Psychiatry

## 2016-01-03 ENCOUNTER — Encounter (HOSPITAL_COMMUNITY): Payer: Self-pay | Admitting: Physical Therapy

## 2016-01-03 ENCOUNTER — Encounter (HOSPITAL_COMMUNITY): Payer: Self-pay | Admitting: Psychiatry

## 2016-01-03 DIAGNOSIS — F419 Anxiety disorder, unspecified: Secondary | ICD-10-CM

## 2016-01-03 NOTE — Progress Notes (Signed)
         THERAPIST PROGRESS NOTE  Session Time:   Tuesday 01/03/2016 11:18 AM -12:03 PM  Participation Level: Active  Behavioral Response: CasualAlert/anxious  Type of Therapy: Individual Therapy  Treatment Goals addressed:   1. Learn and implement calming skills to reduce overall anxiety and manage anxiety symptoms.      2. Learn and implement personal and interpersonal skills to reduce anxiety and improve interpersonal relationships.  Interventions: Supportive  Summary: Gabriela Schultz is a 50 y.o. female who presents with a long standing history of symptoms of anxiety that have been managed well with medication. She experienced increased symptoms when she began having perimenopausal symtoms about six months ago. 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 She reports additional stress regarding change in dynamics in the relationship with her brother-in-law. She is experiencing marital stress related to poor communication as well as husband's alcoholism and controlling behavior per patient's report.   Patient reports continued stress since last session. She reports feeling overwhelmed with various relationships a responsibilities. She expresses ambivalent feelings about maintaining certain relationships are responsibilities. She reports recent conflict with her daughter. 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 Patient and her boyfriend have reconciled the patient continues to experience frustration and anxiety regarding dynamics with his family. She continues to experience guilt regarding the relationship. She also reports stress related to resuming school.  Suicidal/Homicidal: No  Therapist Response:  Reviewed symptoms, facilitated expression of feelings, assisted  patient identifying ways to develop priorities and achieve balance, encouraged patient to journal l  Plan: Return again in 2 weeks.   Diagnosis: Axis I: Anxiety Disorder NOS    Axis II: Deferred    Mang Hazelrigg, LCSW 01/03/2016

## 2016-01-05 ENCOUNTER — Encounter (HOSPITAL_COMMUNITY): Payer: Self-pay

## 2016-01-25 ENCOUNTER — Telehealth (HOSPITAL_COMMUNITY): Payer: Self-pay | Admitting: *Deleted

## 2016-01-25 NOTE — Telephone Encounter (Signed)
returned phone call regarding an appointment. 

## 2016-02-07 ENCOUNTER — Ambulatory Visit (INDEPENDENT_AMBULATORY_CARE_PROVIDER_SITE_OTHER): Payer: BLUE CROSS/BLUE SHIELD | Admitting: Psychiatry

## 2016-02-07 ENCOUNTER — Encounter (HOSPITAL_COMMUNITY): Payer: Self-pay | Admitting: Psychiatry

## 2016-02-07 DIAGNOSIS — F4320 Adjustment disorder, unspecified: Secondary | ICD-10-CM

## 2016-02-07 NOTE — Progress Notes (Signed)
         THERAPIST PROGRESS NOTE  Session Time:   Tuesday  02/07/2016 1:12 PM -  1:59 PM  Participation Level: Active  Behavioral Response: CasualAlert/anxious  Type of Therapy: Individual Therapy  Treatment Goals addressed:   1. Learn and implement calming skills to reduce overall anxiety and manage anxiety symptoms.      2. Learn and implement personal and interpersonal skills to reduce anxiety and improve interpersonal relationships.  Interventions: Supportive  Summary: [name] is a 50 y.o. female who presents with a long standing history of symptoms of anxiety that have been managed well with medication. She experienced increased symptoms when she began having perimenopausal symtoms about six months ago. 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 She reports additional stress regarding change in dynamics in the relationship with her brother-in-law. She is experiencing marital stress related to poor communication as well as husband's alcoholism and controlling behavior per patient's report.   Patient reports continued stress since last session 5 weeks ago. 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 Patient reports using assertiveness skills and establishing contracts with staff. She also fired an employee who had failed repeatedly to meet her financial obligation. Patient also reports stress regarding boyfriend breaking up with her two weeks ago and returning to his wife. She reports being very depressed, angry, and hurt regarding this. She has been using physical exercise and maintaining positive self-care to try to cope. She initially experienced sleep difficulty but reports this is getting better. She has had recent contact with her husband  and expresses ambivalent feelings regarding their contact.   Suicidal/Homicidal: No  Therapist Response:  Reviewed symptoms, facilitated expression of feelings regarding losses and break-up, discussed effects of break-up, discussed  ways to maintain positive self-carea  Plan: Return again in 2 weeks.   Diagnosis: Axis I: Anxiety Disorder NOS    Axis II: Deferred    Zhara Gieske, LCSW 02/07/2016

## 2016-02-21 ENCOUNTER — Ambulatory Visit (INDEPENDENT_AMBULATORY_CARE_PROVIDER_SITE_OTHER): Payer: BLUE CROSS/BLUE SHIELD | Admitting: Psychiatry

## 2016-02-21 ENCOUNTER — Encounter (HOSPITAL_COMMUNITY): Payer: Self-pay | Admitting: Psychiatry

## 2016-02-21 DIAGNOSIS — F419 Anxiety disorder, unspecified: Secondary | ICD-10-CM | POA: Diagnosis not present

## 2016-02-21 NOTE — Progress Notes (Signed)
         THERAPIST PROGRESS NOTE  Session Time:   Tuesday  02/21/2016 2:18 PM -  2:56 PM      Participation Level: Active  Behavioral Response: CasualAlert/anxious  Type of Therapy: Individual Therapy  Treatment Goals addressed:   1. Learn and implement calming skills to reduce overall anxiety and manage anxiety symptoms.      2. Learn and implement personal and interpersonal skills to reduce anxiety and improve interpersonal relationships.  Interventions: Supportive  Summary: Gabriela Schultz is a 50 y.o. female who presents with a long standing history of symptoms of anxiety that have been managed well with medication. She experienced increased symptoms when she began having perimenopausal symtoms about six months ago. 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 She reports additional stress regarding change in dynamics in the relationship with her brother-in-law. She is experiencing marital stress related to poor communication as well as husband's alcoholism and controlling behavior per patient's report.   Patient last was seen about 2 weeks ago. She continues to experience anxiety and excessive worry as well as sadness. She is contemplating dropping her class as she is experiencing fatigue and poor concentration. However, she states she would feel like a failure if she dropped class. She expresses anger and sadness regarding her recent break-up as well as regret regarding some of her choices. She is contemplating reconciling with her ex-husband. 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 Patient reports improved self-care efforts, Suicidal/Homicidal: No  Therapist Response:  Reviewed symptoms, facilitated expression of feelings regarding losses and break-up,  discussed grieving process and began to discuss stages, assisted patient do a cost/benefit analysis regarding dropping class/keeping class, assisted patient identify and challenge cognitive distortions,   Plan: Return again in 2 weeks.   Diagnosis: Axis I: Anxiety Disorder NOS    Axis II: Deferred    Gabriela Jeangilles, LCSW 02/21/2016

## 2016-02-28 ENCOUNTER — Other Ambulatory Visit: Payer: Self-pay | Admitting: Obstetrics and Gynecology

## 2016-02-28 ENCOUNTER — Other Ambulatory Visit (HOSPITAL_COMMUNITY)
Admission: RE | Admit: 2016-02-28 | Discharge: 2016-02-28 | Disposition: A | Discharge status: Home / Self Care | Payer: BLUE CROSS/BLUE SHIELD | Source: Ambulatory Visit | Attending: Obstetrics and Gynecology | Admitting: Obstetrics and Gynecology

## 2016-02-28 DIAGNOSIS — Z1151 Encounter for screening for human papillomavirus (HPV): Secondary | ICD-10-CM | POA: Diagnosis present

## 2016-02-28 DIAGNOSIS — Z01411 Encounter for gynecological examination (general) (routine) with abnormal findings: Secondary | ICD-10-CM | POA: Diagnosis present

## 2016-03-05 ENCOUNTER — Telehealth (HOSPITAL_COMMUNITY): Payer: Self-pay | Admitting: *Deleted

## 2016-03-05 LAB — CYTOLOGY - PAP
Diagnosis: NEGATIVE
HPV: NOT DETECTED

## 2016-03-05 NOTE — Telephone Encounter (Signed)
left voice message, appointment has been canceled as patient requested.   Please call back to reschedule

## 2016-03-06 ENCOUNTER — Ambulatory Visit (HOSPITAL_COMMUNITY): Payer: Self-pay | Admitting: Psychiatry

## 2016-03-20 ENCOUNTER — Encounter (HOSPITAL_COMMUNITY): Payer: Self-pay | Admitting: Psychiatry

## 2016-03-20 ENCOUNTER — Ambulatory Visit (INDEPENDENT_AMBULATORY_CARE_PROVIDER_SITE_OTHER): Payer: BLUE CROSS/BLUE SHIELD | Admitting: Psychiatry

## 2016-03-20 DIAGNOSIS — F419 Anxiety disorder, unspecified: Secondary | ICD-10-CM

## 2016-03-20 NOTE — Progress Notes (Signed)
         THERAPIST PROGRESS NOTE  Session Time:   Tuesday  03/20/2016 2:15 PM -  3:00 PM  Participation Level: Active  Behavioral Response: CasualAlert/anxious  Type of Therapy: Individual Therapy  Treatment Goals addressed:   1. Learn and implement calming skills to reduce overall anxiety and manage anxiety symptoms.      2. Learn and implement personal and interpersonal skills to reduce anxiety and improve interpersonal relationships.  Interventions: Supportive  Summary: Gabriela Schultz is a 50 y.o. female who presents with a long standing history of symptoms of anxiety that have been managed well with medication. She experienced increased symptoms when she began having perimenopausal symtoms about six months ago. 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 She reports additional stress regarding change in dynamics in the relationship with her brother-in-law. She is experiencing marital stress related to poor communication as well as husband's alcoholism and controlling behavior per patient's report.   Patient last was seen about 3-4 weeks ago. She reports increased depressed mood, memory difficulty, tearfulness, and poor concentration. She started taking increased dosage of Prozac last week per her psychiatrist instructions and now takes 80 mg per patient's report. She is reconciling with her husband but continues to experience unresolved grief and loss issues regarding the breakup with the other man. She reports increased negative thoughts about self and beginning to obsess about her appearance as well as her food intake.   Suicidal/Homicidal: No  Therapist Response:  Reviewed symptoms, facilitated expression of feelings regarding losses and break-up, continued discussing grieving process and discussed stage of anger  and current effects on patient, encouraged patient to focus on positive self care  Plan: Return again in 2 weeks.   Diagnosis: Axis I: Anxiety Disorder NOS    Axis II: Deferred    Sarena Jezek, LCSW 03/20/2016

## 2016-04-03 ENCOUNTER — Encounter (HOSPITAL_COMMUNITY): Payer: Self-pay | Admitting: Psychiatry

## 2016-04-03 ENCOUNTER — Ambulatory Visit (INDEPENDENT_AMBULATORY_CARE_PROVIDER_SITE_OTHER): Payer: BLUE CROSS/BLUE SHIELD | Admitting: Psychiatry

## 2016-04-03 DIAGNOSIS — F419 Anxiety disorder, unspecified: Secondary | ICD-10-CM | POA: Diagnosis not present
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
## 2016-04-10 NOTE — Telephone Encounter (Signed)
12/22/2015:  Left message asking for change in apt. Time for earlier that day.  [address]Gabriela Schultz, LPTA; CBIS [phone]

## 2016-04-16 ENCOUNTER — Ambulatory Visit (INDEPENDENT_AMBULATORY_CARE_PROVIDER_SITE_OTHER): Payer: BLUE CROSS/BLUE SHIELD | Admitting: Psychiatry

## 2016-04-16 DIAGNOSIS — F419 Anxiety disorder, unspecified: Secondary | ICD-10-CM | POA: Diagnosis not present

## 2016-04-16 NOTE — Progress Notes (Signed)
       THERAPIST PROGRESS NOTE  Session Time:   Monday 04/16/2016 3:10 PM - 4:00 PM                      Participation Level: Active  Behavioral Response: CasualAlert/anxious/depressed/tearful  Type of Therapy: Individual Therapy  Treatment Goals addressed:   1. Learn and implement calming skills to reduce overall anxiety and manage anxiety symptoms.      2. Learn and implement personal and interpersonal skills to reduce anxiety and improve interpersonal relationships.  Interventions: Supportive/CBT  Summary: Gabriela Schultz is a 50 y.o. female who presents with a long standing history of symptoms of anxiety that have been managed well with medication. She experienced increased symptoms when she began having perimenopausal symtoms about six months ago. 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 She reports additional stress regarding change in dynamics in the relationship with her brother-in-law. She is experiencing marital stress related to poor communication as well as husband's alcoholism and controlling behavior per patient's report.   Patient last was seen about 2 weeks ago. She reports feeling a little better. She reports now taking 60 mg of prozac as prescribed by psychiatrist Dr. Evelene CroonKaur. She also reports using spirituality and focusing more on work as Engineer, drillingcoping tools. She reports she and husband are still working toward reconciliation. She reports some stress relief related to finding another home for one of her 3 dogs. However, she expresses frustration with this as well as continued care for the remaining 2 dogs. She reports continuing to think about other people and not think about her self. Patient continues to struggle with set and maintain boundaries. She also continues to struggle with self  acceptance.  Suicidal/Homicidal: No  Therapist Response:  Reviewed symptoms, praised and reinforced patient's efforts to improve self-care, examined patient's interpersonal schemas, assisted patient identify the effects of early relationships on current relationships and functioning, assisted patient identify, challenge, and replace negative thought patterns about self, discussed dependency issues, and assigned patient to complete handout  "Satisfying Unmet Emotional Needs" and bring to next session.   Plan: Return again in 2 weeks.   Diagnosis: Axis I: Anxiety Disorder NOS    Axis II: Deferred    Jovanie Verge, LCSW 04/16/2016

## 2016-05-02 ENCOUNTER — Ambulatory Visit (HOSPITAL_COMMUNITY): Payer: Self-pay | Admitting: Psychiatry

## 2016-05-16 ENCOUNTER — Ambulatory Visit (INDEPENDENT_AMBULATORY_CARE_PROVIDER_SITE_OTHER): Payer: BLUE CROSS/BLUE SHIELD | Admitting: Psychiatry

## 2016-05-16 ENCOUNTER — Encounter (HOSPITAL_COMMUNITY): Payer: Self-pay | Admitting: Psychiatry

## 2016-05-16 DIAGNOSIS — F419 Anxiety disorder, unspecified: Secondary | ICD-10-CM | POA: Diagnosis not present

## 2016-05-16 NOTE — Progress Notes (Signed)
       THERAPIST PROGRESS NOTE  Session Time:   Wednesday 05/16/2016 3:22 PM - 4:02 PM                        Participation Level: Active  Behavioral Response: CasualAlert/anxious/depressed/tearful  Type of Therapy: Individual Therapy  Treatment Goals addressed:   1. Learn and implement calming skills to reduce overall anxiety and manage anxiety symptoms.      2. Learn and implement personal and interpersonal skills to reduce anxiety and improve interpersonal relationships.  Interventions: Supportive/CBT  Summary: Foy Guadalajarannette C Stefano is a 51 y.o. female who presents with a long standing history of symptoms of anxiety that have been managed well with medication. She experienced increased symptoms when she began having perimenopausal symtoms about six months ago. 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 She reports additional stress regarding change in dynamics in the relationship with her brother-in-law. She is experiencing marital stress related to poor communication as well as husband's alcoholism and controlling behavior per patient's report.   Patient last was seen about 2 weeks ago. She reports feeling better since last session as she states having a break through. She reports no longer struggling with issues she experienced at 51 years old and her first marriage. However, she reports continuing to struggle with issues with current husband. They are still not residing together but are working toward reconciliation. However, she reports he will not communicate with her on major issues in their relationship and sends her mixed messages. She continues to experience anger with the other man with whom she was involved as well as anger with self. She continus to experience losses in relationships with other family members which  was more apparent during the holidays. She has decided to pursue career path for physical therapy and is enrolled in school this semester. She reports additional stress related to daughter having marital issues and planning to move with patient this weekend.  Suicidal/Homicidal: No  Therapist Response:  Reviewed symptoms, praised and reinforced patient's efforts to maintain positive self-care, examined patient's interpersonal schemas, assisted patient identify similarities in her approach to interpersonal relationships during events that happened at age 51 and her current relationship/situation with husband, discussed losses patient has experienced in the past year,   Plan: Return again in 2 weeks.   Diagnosis: Axis I: Anxiety Disorder NOS    Axis II: Deferred    Jaquaveon Bilal, LCSW 05/16/2016

## 2016-06-20 ENCOUNTER — Ambulatory Visit
Admission: RE | Admit: 2016-06-20 | Discharge: 2016-06-20 | Disposition: A | Discharge status: Home / Self Care | Payer: BLUE CROSS/BLUE SHIELD | Source: Ambulatory Visit | Attending: Obstetrics and Gynecology | Admitting: Obstetrics and Gynecology

## 2016-06-20 ENCOUNTER — Ambulatory Visit
Admission: RE | Admit: 2016-06-20 | Discharge: 2016-06-20 | Disposition: A | Discharge status: Home / Self Care | Payer: BLUE CROSS/BLUE SHIELD | Source: Ambulatory Visit | Attending: Internal Medicine | Admitting: Internal Medicine

## 2016-06-20 ENCOUNTER — Other Ambulatory Visit: Payer: Self-pay | Admitting: Obstetrics and Gynecology

## 2016-06-20 ENCOUNTER — Other Ambulatory Visit: Payer: Self-pay | Admitting: Internal Medicine

## 2016-06-20 DIAGNOSIS — Z1231 Encounter for screening mammogram for malignant neoplasm of breast: Secondary | ICD-10-CM

## 2016-06-20 DIAGNOSIS — J0100 Acute maxillary sinusitis, unspecified: Secondary | ICD-10-CM

## 2016-08-20 ENCOUNTER — Emergency Department (HOSPITAL_COMMUNITY): Payer: BLUE CROSS/BLUE SHIELD

## 2016-08-20 ENCOUNTER — Emergency Department (HOSPITAL_COMMUNITY)
Admission: EM | Admit: 2016-08-20 | Discharge: 2016-08-20 | Disposition: A | Discharge status: Home / Self Care | Payer: BLUE CROSS/BLUE SHIELD | Attending: Emergency Medicine | Admitting: Emergency Medicine

## 2016-08-20 ENCOUNTER — Encounter (HOSPITAL_COMMUNITY): Payer: Self-pay | Admitting: Emergency Medicine

## 2016-08-20 DIAGNOSIS — R112 Nausea with vomiting, unspecified: Secondary | ICD-10-CM | POA: Diagnosis present

## 2016-08-20 DIAGNOSIS — R109 Unspecified abdominal pain: Secondary | ICD-10-CM | POA: Diagnosis not present

## 2016-08-20 DIAGNOSIS — R197 Diarrhea, unspecified: Secondary | ICD-10-CM | POA: Diagnosis not present

## 2016-08-20 LAB — URINALYSIS, ROUTINE W REFLEX MICROSCOPIC
Bilirubin Urine: NEGATIVE
Glucose, UA: NEGATIVE mg/dL
Ketones, ur: NEGATIVE mg/dL
Leukocytes, UA: NEGATIVE
Nitrite: NEGATIVE
Protein, ur: NEGATIVE mg/dL
Specific Gravity, Urine: 1.003 — ABNORMAL LOW (ref 1.005–1.030)
pH: 7 (ref 5.0–8.0)

## 2016-08-20 LAB — CBC
HCT: 45.2 % (ref 36.0–46.0)
Hemoglobin: 14.5 g/dL (ref 12.0–15.0)
MCH: 24.2 pg — ABNORMAL LOW (ref 26.0–34.0)
MCHC: 32.1 g/dL (ref 30.0–36.0)
MCV: 75.5 fL — ABNORMAL LOW (ref 78.0–100.0)
Platelets: 259 10*3/uL (ref 150–400)
RBC: 5.99 MIL/uL — ABNORMAL HIGH (ref 3.87–5.11)
RDW: 14.3 % (ref 11.5–15.5)
WBC: 8.5 10*3/uL (ref 4.0–10.5)

## 2016-08-20 LAB — COMPREHENSIVE METABOLIC PANEL
ALT: 20 U/L (ref 14–54)
AST: 24 U/L (ref 15–41)
Albumin: 4.5 g/dL (ref 3.5–5.0)
Alkaline Phosphatase: 90 U/L (ref 38–126)
Anion gap: 10 (ref 5–15)
BUN: 10 mg/dL (ref 6–20)
CO2: 26 mmol/L (ref 22–32)
Calcium: 9.5 mg/dL (ref 8.9–10.3)
Chloride: 103 mmol/L (ref 101–111)
Creatinine, Ser: 0.66 mg/dL (ref 0.44–1.00)
GFR calc Af Amer: >60 mL/min (ref >60)
GFR calc non Af Amer: >60 mL/min (ref >60)
Glucose, Bld: 107 mg/dL — ABNORMAL HIGH (ref 65–99)
Potassium: 3.1 mmol/L — ABNORMAL LOW (ref 3.5–5.1)
Sodium: 139 mmol/L (ref 135–145)
Total Bilirubin: 0.7 mg/dL (ref 0.3–1.2)
Total Protein: 8.1 g/dL (ref 6.5–8.1)

## 2016-08-20 LAB — LIPASE, BLOOD: Lipase: 27 U/L (ref 11–51)

## 2016-08-20 MED ORDER — ONDANSETRON 4 MG PO TBDP: 4.0000 mg | ORAL_TABLET | Freq: Three times a day (TID) | ORAL | 0 refills | Status: DC | PRN

## 2016-08-20 MED ORDER — ONDANSETRON 4 MG PO TBDP
4.0000 mg | ORAL_TABLET | Freq: Once | ORAL | Status: AC
  Administered 2016-08-20: 4 mg via ORAL
  Filled 2016-08-20: qty 1

## 2016-08-20 MED ORDER — SODIUM CHLORIDE 0.9 % IV BOLUS (SEPSIS)
1000.0000 mL | Freq: Once | INTRAVENOUS | Status: AC
  Administered 2016-08-20: 1000 mL via INTRAVENOUS

## 2016-08-20 NOTE — ED Triage Notes (Signed)
Pt reports abd pain with nausea and vomiting since Friday.  States she initially had diarrhea but that has resolved.  Main concern is that she is now belching what tastes like rotten eggs with a lot of abd distention and bloating.

## 2016-08-20 NOTE — ED Provider Notes (Signed)
Emergency Department Provider Note  By signing my name below, I, Christian Mate, attest that this documentation has been prepared under the direction and in the presence of Maia Plan, MD. Electronically Signed: Marnette Burgess Theodosia Bahena, Scribe. 08/20/2016. 1:12 PM.   I have reviewed the triage vital signs and the nursing notes.   HISTORY  Chief Complaint Abdominal Pain and Emesis   HPI Comments:  Gabriela Schultz is a 51 y.o. female with a PMhx of Eating Disorder, DDD, and Fibromyalgia, who presents to the Emergency Department complaining of constant, 6/10 abdominal pain onset three days ago. She reports three days ago, feeling "full in her stomach" after eating followed by nausea and multiple episodes of emesis. She states vomiting that night and into the next morning with some relief two days ago following the multiple episodes of emesis in the morning. She has since had some associated abdominal distension, belching, and passing frequent gas after eating later in the afternoon that same day. Pt has additional associated symptoms of diarrhea that has since resolved, HA, and a low grade fever. Pt is able to tolerate liquids well with food ingestion causing her nausea. Past h/o reflux that she is on Protonix for. No past h/o hiatal hernia or SBO's. Positive sick contact with similar symptoms noted at work and home. Pt denies chills, dysuria, CP, SOB, constipation, and any other complaints at this time.  1:14 PM Pt reports feeling better after ED administration of Zofran.    Past Medical History:  Diagnosis Date  . Degenerative disc disease, cervical June 2017  . Eating disorder   . Fibromyalgia   . Headache(784.0)   . Neck pain    There are no active problems to display for this patient.  Past Surgical History:  Procedure Laterality Date  . 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 TUBAL LIGATION     Current Outpatient Rx  . Order #: [id number] Class:  Historical Med  . Order #: [id number] Class: Historical Med  . Order #: [id number] Class: Historical Med  . Order #: [id number] Class: Historical Med  . Order #: [id number] Class: Historical Med  . Order #: [id number] Class: Historical Med  . Order #: [id number] Class: Print  . Order #: [id number] Class: Historical Med  . Order #: [id number] Class: Historical Med  . Order #: [id number] Class: Historical Med  . Order #: [id number] Class: Historical Med  . Order #: [id number] Class: Historical Med  . Order #: [id number] Class: Historical Med  . Order #: [id number] Class: Print   Allergies Benadryl [diphenhydramine]; Doxycycline; Erythromycin; Flagyl [metronidazole]; Latex; and Vicodin [hydrocodone-acetaminophen]  Family History  Problem Relation Age of Onset  . Anxiety disorder Mother   . Anxiety disorder Maternal Aunt   . Depression Maternal Grandmother   . Depression Paternal Grandmother   . Anxiety disorder Daughter     Social History Social History  Substance Use Topics  . Smoking status: Never Smoker  . Smokeless tobacco: Never Used  . Alcohol use No   Review of Systems  Constitutional: No chills. Positive fever.  Eyes: No visual changes. ENT: No sore throat. Cardiovascular: Denies chest pain. Respiratory: Denies shortness of breath. Gastrointestinal: Positive abdominal pain, nausea, vomiting.  Positive diarrhea that has since resolved. Positive abdominal distension. No constipation. Genitourinary: Negative for dysuria. Musculoskeletal: Negative for back pain. Skin: Negative for rash. Neurological: Negative for focal weakness or numbness. Positive HA.   10-point ROS otherwise negative.  ____________________________________________   PHYSICAL EXAM:  VITAL SIGNS: ED Triage Vitals  Enc Vitals Group     BP 08/20/16 1024 (!) 142/109     Pulse Rate 08/20/16 1024 99     Resp 08/20/16 1024 18     Temp 08/20/16 1024 98.6 F (37 C)     Temp Source 08/20/16 1024 Oral     SpO2 08/20/16 1024 99  %     Weight 08/20/16 1025 155 lb (70.3 kg)     Height 08/20/16 1025  (1.6 m)     Pain Score 08/20/16 1023 6   Constitutional: Alert and oriented. Well appearing and in no acute distress. Eyes: Conjunctivae are normal.  Head: Atraumatic. Nose: No congestion/rhinnorhea. Mouth/Throat: Mucous membranes are dry.  Neck: No stridor.  Cardiovascular: Normal rate, regular rhythm. Good peripheral circulation. Grossly normal heart sounds.   Respiratory: Normal respiratory effort.  No retractions. Lungs CTAB. Gastrointestinal: Soft with mild LUQ tenderness. No rebound or guarding. No distention.  Musculoskeletal: No lower extremity tenderness nor edema. No gross deformities of extremities. Neurologic:  Normal speech and language. No gross focal neurologic deficits are appreciated.  Skin:  Skin is warm, dry and intact. No rash noted.  ____________________________________________   LABS (all labs ordered are listed, but only abnormal results are displayed)  Labs Reviewed  COMPREHENSIVE METABOLIC PANEL - Abnormal; Notable for the following:       Result Value   Potassium 3.1 (*)    Glucose, Bld 107 (*)    All other components within normal limits  CBC - Abnormal; Notable for the following:    RBC 5.99 (*)    MCV 75.5 (*)    MCH 24.2 (*)    All other components within normal limits  URINALYSIS, ROUTINE W REFLEX MICROSCOPIC - Abnormal; Notable for the following:    Color, Urine STRAW (*)    Specific Gravity, Urine 1.003 (*)    Hgb urine dipstick SMALL (*)    Bacteria, UA RARE (*)    Squamous Epithelial / LPF 0-5 (*)    All other components within normal limits  LIPASE, BLOOD   ____________________________________________  RADIOLOGY  Dg Abdomen Acute W/chest  Result Date: 08/20/2016 CLINICAL DATA:  Vomiting and diarrhea since Friday night. EXAM: DG ABDOMEN ACUTE W/ 1V CHEST COMPARISON:  Chest x-ray 04/06/2015 FINDINGS: The upright chest x-ray is normal. The heart and mediastinal  contours are normal in stable. The lungs are clear. No pleural effusion. Two views of the abdomen demonstrate air fluid and fluid level in the stomach. There is scattered air and stool in the colon and air throughout the small bowel which is not dilated. No findings for small bowel obstruction or free air. Findings could be due to gastroenteritis. The soft tissue shadows of the abdomen are maintained. No worrisome calcifications. Bilateral tubal ligation clips are noted in the pelvis. The bony structures are unremarkable. IMPRESSION: Normal chest x-ray. Nonspecific bowel gas pattern. Possible gastroenteritis but no findings for obstruction or perforation. Electronically Signed   By: Rudie Meyer M.D.   On: 08/20/2016 14:46    ____________________________________________   PROCEDURES  Procedure(s) performed:   Procedures  None ____________________________________________   INITIAL IMPRESSION / ASSESSMENT AND PLAN / ED COURSE  Pertinent labs & imaging results that were available during my care of the patient were reviewed by me and considered in my medical decision making (see chart for details).  Patient presents to the emergency department for evaluation of mild left upper quadrant abdominal discomfort with bloating, early satiety, nausea, vomiting, and small amount of diarrhea  over the last 3 days. No bilious or bloody emesis or stool. No fever/chills. Does have a sick contact at work. Labs from triage with only mild hypokalemia. With bloating and large amount of gas plan for plain film of the abdomen to evaluate bowel gas pattern and consider pSBO vs illeus vs hiatal hernia vs viral infection. Patient feeling better after Zofran in the waiting room. Will give IVF as well and PO challenge after plain films.   03:15 PM Patient feeling better. No SBO or other concerning pattern on plain film. Patient is tolerating PO without difficulty.   At this time, I do not feel there is any  life-threatening condition present. I have reviewed and discussed all results (EKG, imaging, lab, urine as appropriate), exam findings with patient. I have reviewed nursing notes and appropriate previous records.  I feel the patient is safe to be discharged home without further emergent workup. Discussed usual and customary return precautions. Patient and family (if present) verbalize understanding and are comfortable with this plan.  Patient will follow-up with their primary care provider. If they do not have a primary care provider, information for follow-up has been provided to them. All questions have been answered.  ____________________________________________  FINAL CLINICAL IMPRESSION(S) / ED DIAGNOSES  Final diagnoses:  Nausea vomiting and diarrhea     MEDICATIONS GIVEN DURING THIS VISIT:  Medications  ondansetron (ZOFRAN-ODT) disintegrating tablet 4 mg (4 mg Oral Given 08/20/16 1030)  sodium chloride 0.9 % bolus 1,000 mL (1,000 mLs Intravenous New Bag/Given 08/20/16 1340)     NEW OUTPATIENT MEDICATIONS STARTED DURING THIS VISIT:  New Prescriptions   ONDANSETRON (ZOFRAN ODT) 4 MG DISINTEGRATING TABLET    Take 1 tablet (4 mg total) by mouth every 8 (eight) hours as needed for nausea or vomiting.   I personally performed the services described in this documentation, which was scribed in my presence. The recorded information has been reviewed and is accurate.    Note:  This document was prepared using Dragon voice recognition software and may include unintentional dictation errors.  Alona Bene, MD Emergency Medicine    Maia Plan, MD 08/20/16 1520

## 2016-08-20 NOTE — ED Notes (Signed)
Nurse first rounds. Pt updated on wait times, VSS, nausea improved after Zofran.

## 2016-08-20 NOTE — Discharge Instructions (Signed)

## 2016-11-30 IMAGING — DX DG CHEST 2V
2 series · 2 of 2 positions shown · non-contrast
Comparison: May 19, 2008

CLINICAL DATA: Palpitations for a few days.

EXAM:
CHEST  2 VIEW

[chest pa]
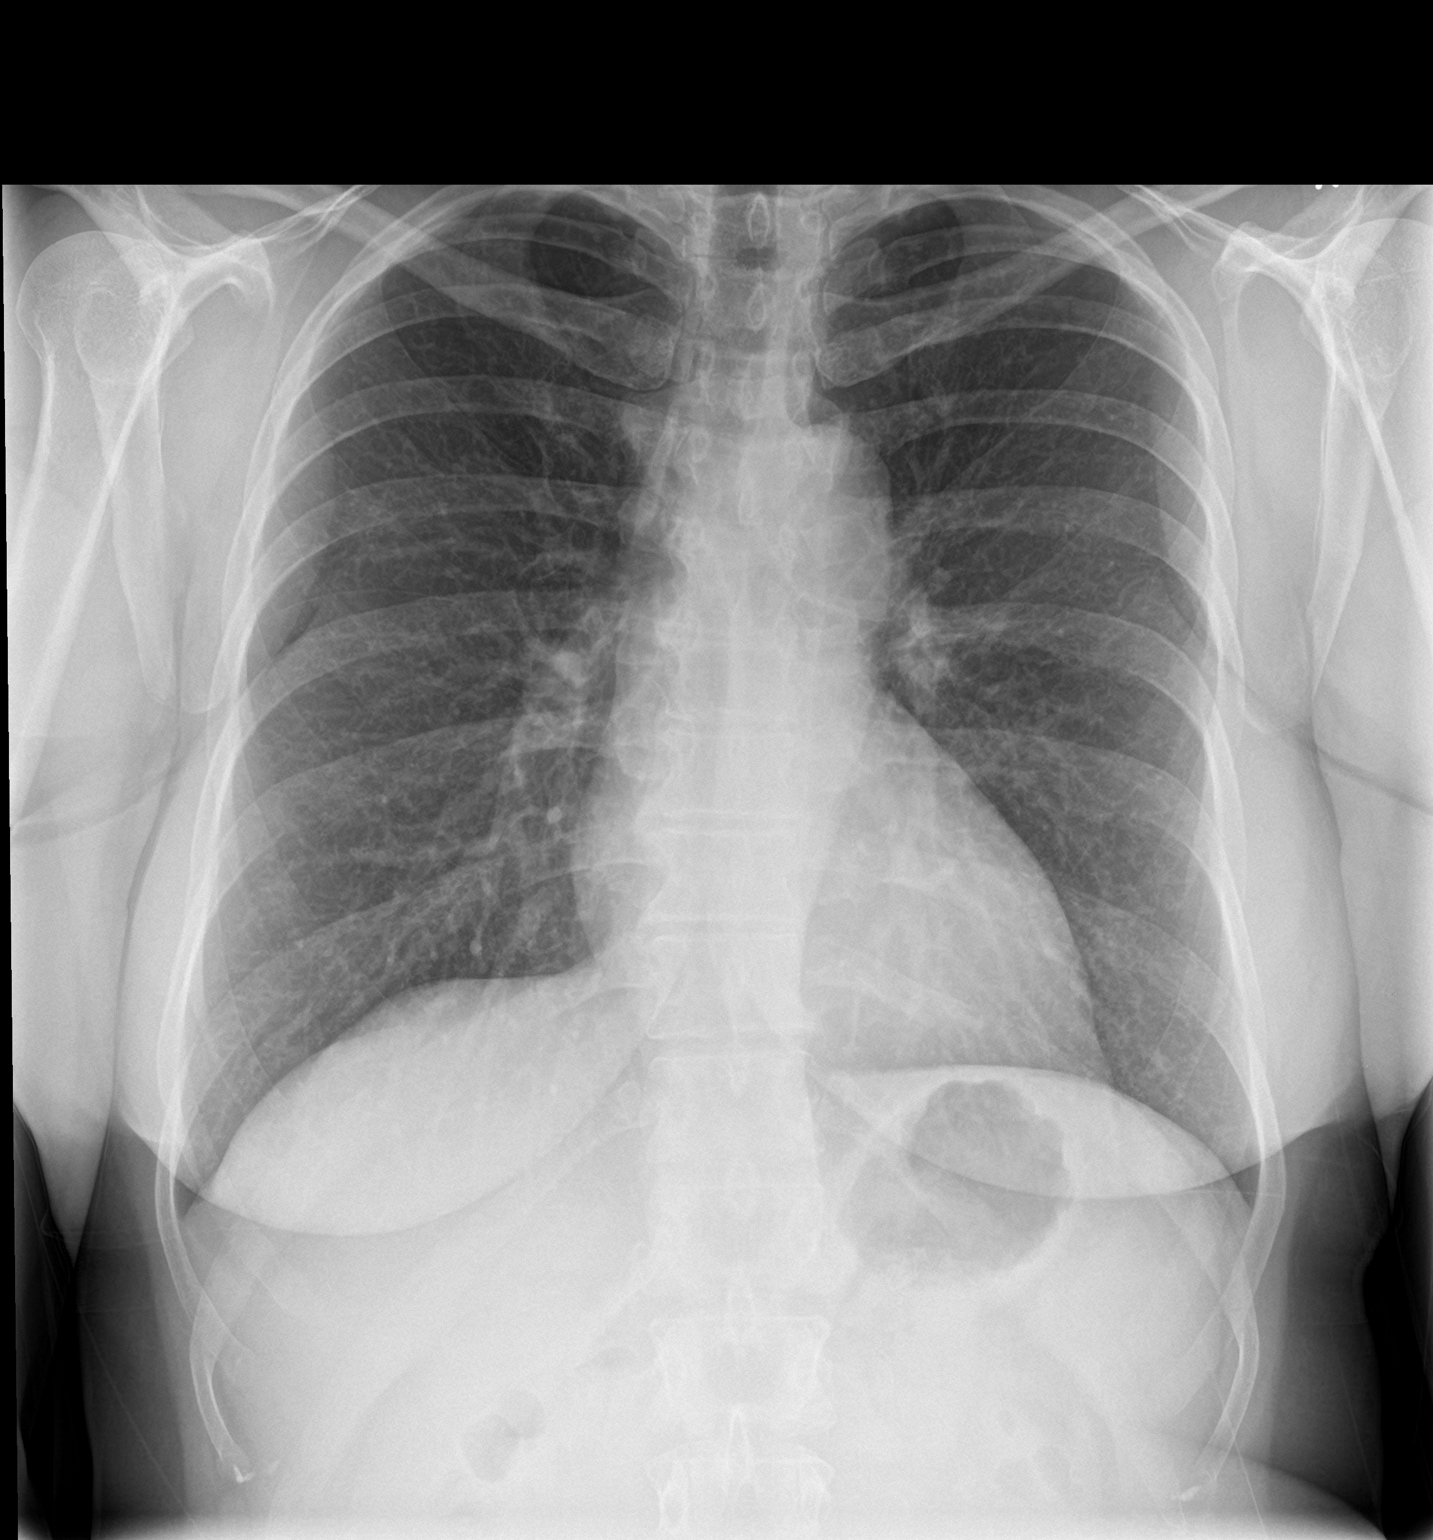

[chest lat]
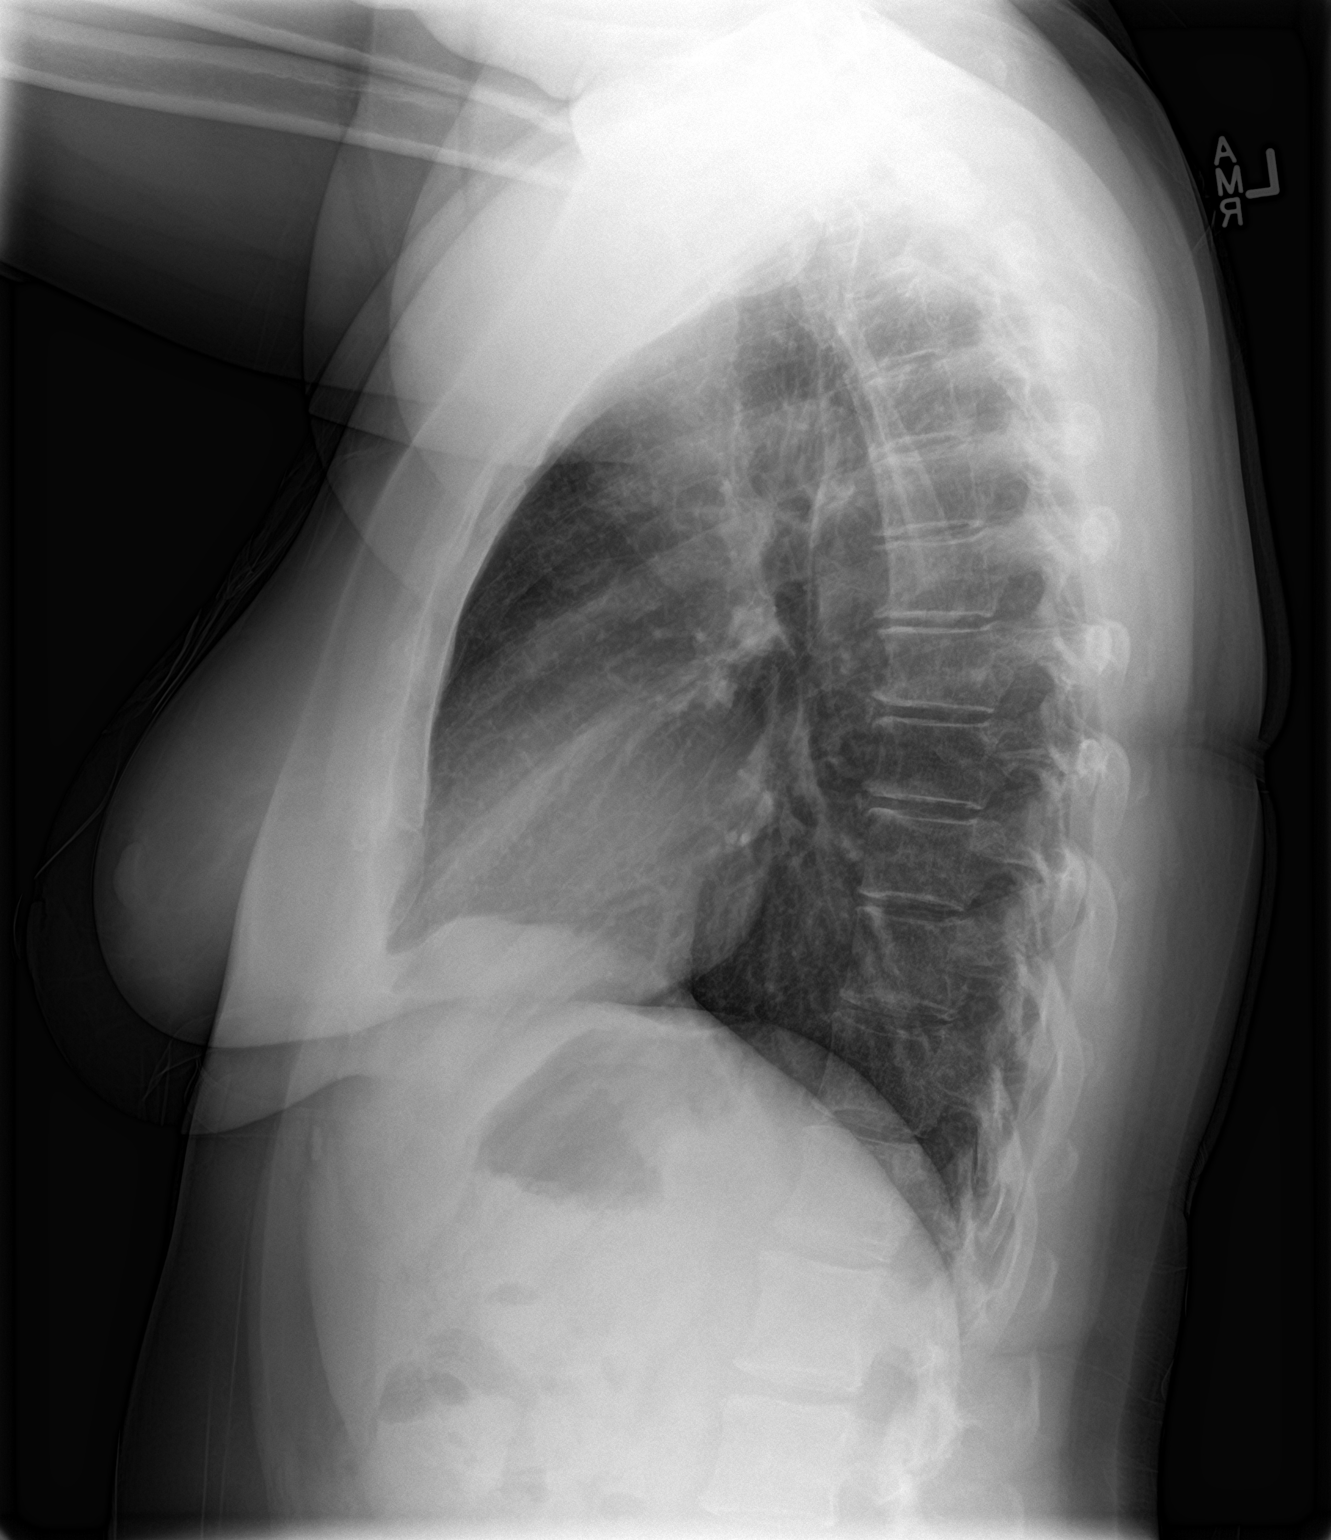

[2 of 2 positions shown; findings below may reference images not displayed]

FINDINGS: The heart size and mediastinal contours are within normal limits.
There is no focal infiltrate, pulmonary edema, or pleural effusion.
The visualized skeletal structures are unremarkable.
IMPRESSION: No active cardiopulmonary disease.

## 2017-05-06 ENCOUNTER — Encounter (HOSPITAL_COMMUNITY): Payer: Self-pay | Admitting: Psychiatry

## 2017-05-06 ENCOUNTER — Ambulatory Visit (HOSPITAL_COMMUNITY): Payer: BLUE CROSS/BLUE SHIELD | Admitting: Psychiatry

## 2017-05-06 DIAGNOSIS — F419 Anxiety disorder, unspecified: Secondary | ICD-10-CM | POA: Diagnosis not present

## 2017-05-06 NOTE — Progress Notes (Signed)
       THERAPIST PROGRESS NOTE  Session Time:  Monday 05/06/2017 8:20 AM  - 9:10 AM                                      Participation Level: Active  Behavioral Response: CasualAlert/euthymic  Type of Therapy: Individual Therapy  Treatment Goals addressed:   1. Learn and implement calming skills to reduce overall anxiety and manage anxiety symptoms.      2. Learn and implement personal and interpersonal skills to reduce anxiety and improve interpersonal relationships.  Interventions: Supportive/CBT  Summary: Gabriela Schultz is a 51 y.o. female who presents with a long standing history of symptoms of anxiety that have been managed well with medication. She experienced increased symptoms when she began having perimenopausal symtoms about six months ago. 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 She reports additional stress regarding change in dynamics in the relationship with her brother-in-law. She is experiencing marital stress related to poor communication as well as husband's alcoholism and controlling behavior per patient's report.   Patient last was seen in January 2018. She is resuming services today due to stress regarding phase of life issues, self-confidence issues, and interpersonal issues. She reports continued on and off relationship with husband since last session. They work toward reconciliation for a while but patient reports no improvement in the relationship. She reports realizing during  her last contact on Christmas day nothing had changed in their relationship. She now wants to pursue being confident being alone but expresses fear as she has always been in a relationship. She reports continuing to have a business/professional relationship with her daughter as they own a salon/massage therapy business but  expresses a desire to have a separate business from her daughter. She reports continued difficulty being assertive and setting/maintain boundaries in the relationship with her daughter as well  other relationships.  Suicidal/Homicidal: No  Therapist Response:  Gathered information, discussed stressors, facilitated expression of thoughts and feelings, assisted patient examine her patterns in relationships, began to discuss the role of mindfulness and regulating emotions and learning to cope with painful emotions like loneliness  Plan: Return again in 2 weeks.   Diagnosis: Axis I: Anxiety Disorder NOS    Axis II: Deferred    Jarett Dralle, LCSW 05/06/2017

## 2017-05-20 ENCOUNTER — Ambulatory Visit (HOSPITAL_COMMUNITY): Payer: Self-pay | Admitting: Psychiatry

## 2017-05-30 ENCOUNTER — Ambulatory Visit (HOSPITAL_COMMUNITY): Payer: BLUE CROSS/BLUE SHIELD | Admitting: Psychiatry

## 2017-05-30 ENCOUNTER — Encounter (HOSPITAL_COMMUNITY): Payer: Self-pay | Admitting: Psychiatry

## 2017-05-30 DIAGNOSIS — F411 Generalized anxiety disorder: Secondary | ICD-10-CM | POA: Diagnosis not present

## 2017-05-30 NOTE — Progress Notes (Signed)
          THERAPIST PROGRESS NOTE  Session Time:  Thursday 05/30/2017 9:18 AM - 10:05 AM                                   Participation Level: Active  Behavioral Response: CasualAlert/anxious  Type of Therapy: Individual Therapy  Treatment Goals addressed:   1. Learn and implement calming skills to reduce overall anxiety and manage anxiety symptoms.      2. Learn and implement personal and interpersonal skills to reduce anxiety and improve interpersonal relationships.  Interventions: Supportive/CBT  Summary: Gabriela Schultz is a 52 y.o. female who presents with a long standing history of symptoms of anxiety that have been managed well with medication. She experienced increased symptoms when she began having perimenopausal symtoms about six months ago. 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 She reports additional stress regarding change in dynamics in the relationship with her brother-in-law. She is experiencing marital stress related to poor communication as well as husband's alcoholism and controlling behavior per patient's report.   Patient last was seen 2 weeks ago. She reports minimal depression but continued symptoms of anxiety. She continues to experience nervousness, poor concentration, muscle tension, and worrying. 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 Patient is hopeful this will enhance her efforts to set and maintain boundaries with daughter regarding their professional relationship. Patient reports she has been on a few dates with various people since last session but says she really wants to concentrate on just being alone. She reports trying to set and maintain boundaries with a  man she recently met. Patient continues to take 60 mg of Prozac as prescribed by psychiatrist Dr. Toy Care.   Suicidal/Homicidal: No  Therapist Response:  Reviewed symptoms, administered GAD-7, praise and reinforced patient's efforts to set and maintain boundaries, discussed effects on patient's mood/behavior/and interaction with others, discussed stressors, facilitated expression of thoughts and feelings, discussed stress since supports, began to develop treatment plan   Plan: Return again in 2 weeks.   Diagnosis: Axis I: Anxiety Disorder NOS    Axis II: Deferred    Isael Stille, LCSW 05/30/2017

## 2017-06-17 ENCOUNTER — Encounter (HOSPITAL_COMMUNITY): Payer: Self-pay | Admitting: Psychiatry

## 2017-06-17 ENCOUNTER — Ambulatory Visit (HOSPITAL_COMMUNITY): Payer: BLUE CROSS/BLUE SHIELD | Admitting: Psychiatry

## 2017-06-17 DIAGNOSIS — F411 Generalized anxiety disorder: Secondary | ICD-10-CM

## 2017-06-17 NOTE — Progress Notes (Signed)
                       THERAPIST PROGRESS NOTE  Session Time:  Monday 06/17/2017 9:11 AM - 10:05 AM                                Participation Level: Active  Behavioral Response: CasualAlert/ACT  Type of Therapy: Individual Therapy  Treatment Goals addressed:   1. Learn and implement calming skills to reduce overall anxiety and manage anxiety symptoms.      2. Learn and implement personal and interpersonal skills to reduce anxiety and improve interpersonal relationships.  Interventions: Supportive/CBT  Summary: Gabriela Schultz is a 52 y.o. female who presents with a long standing history of symptoms of anxiety that have been managed well with medication. She experienced increased symptoms when she began having perimenopausal symtoms about six months ago. 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 She reports additional stress regarding change in dynamics in the relationship with her brother-in-law. She is experiencing marital stress related to poor communication as well as husband's alcoholism and controlling behavior per patient's report.   Patient last was seen 2 weeks ago. She reports improved mood and decreased anxiety. She reports recently enjoying a five-day cruise with her friends. She reports becoming more aware of her perfectionistic, critical tendencies about self and others and trying to learn to be more accepting. She reports incidents in which she has made successful efforts to set and maintain boundaries in the relationship with her daughter. Patient is dating someone now but also is very fearful of making a mistake due to her history in relationships and recent comments from her daughter. She reports having difficulty enjoying the moment and being afraid of being hurt.  Suicidal/Homicidal: No  Therapist  Response:  Reviewed symptoms,  praised and reinforced patient's efforts to set and maintain boundaries, discussed effects on patient's mood/behavior/and interaction with others, discussed observing mind and thinking mind, used defusion technique to assist patient cope with ruminating thoughts, asked patient to practice between sessions  Plan: Return again in 2 weeks.   Diagnosis: Axis I: Anxiety Disorder NOS    Axis II: Deferred    Ninnie Fein, LCSW 06/17/2017

## 2017-07-01 ENCOUNTER — Encounter (HOSPITAL_COMMUNITY): Payer: Self-pay | Admitting: Psychiatry

## 2017-07-01 ENCOUNTER — Ambulatory Visit (HOSPITAL_COMMUNITY): Payer: BLUE CROSS/BLUE SHIELD | Admitting: Psychiatry

## 2017-07-01 DIAGNOSIS — F411 Generalized anxiety disorder: Secondary | ICD-10-CM

## 2017-07-01 NOTE — Progress Notes (Signed)
                              THERAPIST PROGRESS NOTE  Session Time:  Monday 07/01/2017 1:10 PM  -  2:08 PM                               Participation Level: Active  Behavioral Response: CasualAlert/CBT  Type of Therapy: Individual Therapy  Treatment Goals addressed:   1. Learn and implement calming skills to reduce overall anxiety and manage anxiety symptoms.      2. Learn and implement personal and interpersonal skills to reduce anxiety and improve interpersonal relationships.  Interventions: Supportive/CBT  Summary: TANEESHA EDGIN is a 52 y.o. female who presents with a long standing history of symptoms of anxiety that have been managed well with medication. She experienced increased symptoms when she began having perimenopausal symtoms about six months ago. 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 She reports additional stress regarding change in dynamics in the relationship with her brother-in-law. She is experiencing marital stress related to poor communication as well as husband's alcoholism and controlling behavior per patient's report.   Patient last was seen 2 weeks ago. She reports continued improved mood and decreased anxiety. She reports ending a relationship with a female friend she began dating a few months ago. She is pleased with her use of assertiveness skills and setting/maintaining boundaries in that relationship. She reports being able to be more present and engaged in life as she has used defusion technique practiced in last session. She has met another man whom she has begun dating. She reports enjoying the relationship but beginning to have fears and worries that the relationship is too good to be true. She fears he will eventually leave her although she has no evidence to support  this.  Suicidal/Homicidal: No  Therapist Response:  Reviewed symptoms,  praised and reinforced patient's efforts to set and maintain boundaries, discussed effects on patient's mood/behavior/and interaction with others, reviewed observing mind and thinking mind, used defusion technique to assist patient cope with ruminating thoughts, asked patient to practice between sessions  Plan: Return again in 2 weeks.   Diagnosis: Axis I: Anxiety Disorder NOS    Axis II: Deferred    Laporsche Hoeger, LCSW 07/01/2017

## 2017-07-15 ENCOUNTER — Ambulatory Visit (HOSPITAL_COMMUNITY): Payer: Self-pay | Admitting: Psychiatry

## 2017-07-24 ENCOUNTER — Ambulatory Visit (HOSPITAL_COMMUNITY): Payer: Self-pay | Admitting: Psychiatry

## 2017-07-29 ENCOUNTER — Ambulatory Visit (HOSPITAL_COMMUNITY): Payer: BLUE CROSS/BLUE SHIELD | Admitting: Psychiatry

## 2017-07-29 ENCOUNTER — Encounter (HOSPITAL_COMMUNITY): Payer: Self-pay | Admitting: Psychiatry

## 2017-07-29 DIAGNOSIS — F411 Generalized anxiety disorder: Secondary | ICD-10-CM | POA: Diagnosis not present

## 2017-07-29 NOTE — Progress Notes (Signed)
                              THERAPIST PROGRESS NOTE  Session Time:  Monday 07/29/2017 11:17 AM - 12:02 AM                               Participation Level: Active  Behavioral Response: CasualAlert/  Type of Therapy: Individual Therapy/CBT  Treatment Goals addressed:   1. Learn and implement calming skills to reduce overall anxiety and manage anxiety symptoms.      2. Learn and implement personal and interpersonal skills to reduce anxiety and improve interpersonal relationships.   Interventions: Supportive/CBT  Summary: Gabriela Schultz is a 52 y.o. female who presents with a long standing history of symptoms of anxiety that have been managed well with medication. She experienced increased symptoms when she began having perimenopausal symtoms about six months ago. 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 She reports additional stress regarding change in dynamics in the relationship with her brother-in-law. She is experiencing marital stress related to poor communication as well as husband's alcoholism and controlling behavior per patient's report.   Patient last was seen 4 weeks ago. She reports increased depressed mood and anxiety since last session. Per patient's report, her female friend told her about 2 weeks ago he is not ready for a serious relationship. She reports being very sad and confused by this. 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 Patient expresses embarrassment, sadness, and anger. She states she did not see any of her client's last week. She did try to remain involved in activities including exercising, taking care of her parents, doing light household chores, and going to recreational  activities. 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  Suicidal/Homicidal: No  Therapist Response:  Reviewed symptoms, discussed stressors, facilitated expression of thoughts and feelings, discussed ways to maintain positive behavioral activation, assisted patient identify/challenge/and replace negative automatic thoughts about self with realistic healthy alternatives  Plan: Return again in 2 weeks.   Diagnosis: Axis I: Anxiety Disorder NOS    Axis II: Deferred    Benjy Kana, LCSW 07/29/2017

## 2017-08-12 ENCOUNTER — Ambulatory Visit (HOSPITAL_COMMUNITY): Payer: Self-pay | Admitting: Psychiatry

## 2017-09-02 ENCOUNTER — Ambulatory Visit (HOSPITAL_COMMUNITY): Payer: BLUE CROSS/BLUE SHIELD | Admitting: Psychiatry

## 2017-09-02 ENCOUNTER — Encounter (HOSPITAL_COMMUNITY): Payer: Self-pay | Admitting: Psychiatry

## 2017-09-02 DIAGNOSIS — F411 Generalized anxiety disorder: Secondary | ICD-10-CM | POA: Diagnosis not present

## 2017-09-02 NOTE — Progress Notes (Signed)
                              THERAPIST PROGRESS NOTE  Session Time:  Monday 09/02/2017 11:11 AM -  11:58 AM                                    Participation Level: Active  Behavioral Response: CasualAlert/  Type of Therapy: Individual Therapy/CBT  Treatment Goals addressed:   1. Learn and implement calming skills to reduce overall anxiety and manage anxiety symptoms.      2. Learn and implement personal and interpersonal skills to reduce anxiety and improve interpersonal relationships.   Interventions: Supportive/CBT  Summary: Gabriela Schultz is a 52 y.o. female who presents with a long standing history of symptoms of anxiety that have been managed well with medication. She experienced increased symptoms when she began having perimenopausal symtoms about six months ago. 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 She reports additional stress regarding change in dynamics in the relationship with her brother-in-law. She is experiencing marital stress related to poor communication as well as husband's alcoholism and controlling behavior per patient's report.   Patient last was seen 4 weeks ago. She reports feeling better since last session. However, she reports she did have a period of depressed mood triggered by seeing a post on FaceBook about her ex-boyfriend being involved in another relationship. This triggered feelings of rejection and negative thoughts about self. Patient reports eventually resuming involvement in activity and socialization. She is now interested in another man but reports still sometimes having negative thoughts and feelings about self. She reports recently talking to her last husband and feeling guilt regarding her affair during their marriage. She states having difficulty forgiving self and now feeling  she has to be there for him as he will be having hip surgery in the near future. 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 Patient reports this is working well. Suicidal/Homicidal: No  Therapist Response:  Reviewed symptoms, discussed stressors, praised patient's use of assertiveness skills in relationship with daughter, facilitated expression of thoughts and feelings, discussed the cognitive model using handout and examples from patient's life, discussed the rationale for and practiced completing thought log, assigned patient to complete once daily and bring to next sessionPlan: Return again in 2 weeks.   Diagnosis: Axis I: Generalized Anxiety Disorder    Axis II: Deferred    Chereese Cilento, LCSW 09/02/2017

## 2017-09-13 ENCOUNTER — Ambulatory Visit (HOSPITAL_COMMUNITY): Payer: BLUE CROSS/BLUE SHIELD | Admitting: Psychiatry

## 2017-09-23 ENCOUNTER — Ambulatory Visit (HOSPITAL_COMMUNITY): Payer: BLUE CROSS/BLUE SHIELD | Admitting: Psychiatry

## 2017-09-23 ENCOUNTER — Encounter (HOSPITAL_COMMUNITY): Payer: Self-pay | Admitting: Psychiatry

## 2017-09-23 DIAGNOSIS — F411 Generalized anxiety disorder: Secondary | ICD-10-CM | POA: Diagnosis not present

## 2017-09-23 NOTE — Progress Notes (Signed)
                              THERAPIST PROGRESS NOTE  Session Time:  Monday 09/23/2017 8:14 AM -  9:05 AM                              Participation Level: Active  Behavioral Response: CasualAlert/  Type of Therapy: Individual Therapy/CBT  Treatment Goals addressed:   1. Learn and implement calming skills to reduce overall anxiety and manage anxiety symptoms.      2. Learn and implement personal and interpersonal skills to reduce anxiety and improve interpersonal relationships.   Interventions: Supportive/CBT  Summary: JONEISHA MILES is a 52 y.o. female who presents with a long standing history of symptoms of anxiety that have been managed well with medication. She experienced increased symptoms when she began having perimenopausal symtoms about six months ago. 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 She reports additional stress regarding change in dynamics in the relationship with her brother-in-law. She is experiencing marital stress related to poor communication as well as husband's alcoholism and controlling behavior per patient's report.   Patient last was seen 3  weeks ago. She reports increased stress, anxiety, and sadness since last session. She reports continuing to date man she was dating last month but also has had contact with her ex-husband. She continues to report feeling guilty about their past and remains indecisive about what she wants regarding the relationship. She has significant difficulty identifying and articulating her feelings. She expresses frustration as she states not knowing what daughter or ex-husband wants from her and putting what she wants last. Patient states not wanting to hurt anyone and reports continued difficulty setting limits. She reports using thought log but did not bring to  session. Patient reports continued medication compliance and an upcoming appointment with her psychiatrist.    Suicidal/Homicidal: No  Therapist Response:  Reviewed symptoms, discussed stressors,  facilitated expression of thoughts and feelings, reviewed  the cognitive model using handout and examples from patient's life, did practice exercises to assist patient identify connection between thoughts, emotions, and behavior, assigned patient practice exercises to complete for homework and bring to next session discussed the rationale for and practiced completing thought log, assigned patient to complete once daily and bring to next session  Plan: Return again in 2 weeks.   Diagnosis: Axis I: Generalized Anxiety Disorder    Axis II: Deferred    Truth Barot, LCSW 09/23/2017

## 2017-10-07 ENCOUNTER — Encounter (HOSPITAL_COMMUNITY): Payer: Self-pay | Admitting: Psychiatry

## 2017-10-07 ENCOUNTER — Ambulatory Visit (INDEPENDENT_AMBULATORY_CARE_PROVIDER_SITE_OTHER): Payer: BLUE CROSS/BLUE SHIELD | Admitting: Psychiatry

## 2017-10-07 DIAGNOSIS — F411 Generalized anxiety disorder: Secondary | ICD-10-CM

## 2017-10-07 NOTE — Progress Notes (Signed)
                              THERAPIST PROGRESS NOTE  Session Time:  Monday 10/07/2017 3:07 PM -  4:00 PM                                    Participation Level: Active  Behavioral Response: CasualAlert/  Type of Therapy: Individual Therapy/CBT  Treatment Goals addressed:   1. Learn and implement calming skills to reduce overall anxiety and manage anxiety symptoms.      2. Learn and implement personal and interpersonal skills to reduce anxiety and improve interpersonal relationships.   Interventions: Supportive/CBT  Summary: Gabriela Schultz is a 52 y.o. female who presents with a long standing history of symptoms of anxiety that have been managed well with medication. She experienced increased symptoms when she began having perimenopausal symtoms about six months ago. 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 She reports additional stress regarding change in dynamics in the relationship with her brother-in-law. She is experiencing marital stress related to poor communication as well as husband's alcoholism and controlling behavior per patient's report.   Patient last was seen 2 weeks ago. She reports continued stress, anxiety, and sadness since last session. 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 Patient  reports additional stress related to her estranged husband telling her this weekend he wanted a divorce and then said he wanted to work things out once she said she would consult an attorney about her financial rights in the divorce. 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 This created more stress as this adds to patient's financial worries. She has a job Copyinterview tomorrow for a part time job  to supplement income.    Suicidal/Homicidal: No  Therapist Response:  Reviewed symptoms, discussed stressors,  facilitated expression of thoughts and feelings, validated feelings, discussed connection between thoughts/mmo/behavior using examples from patient's recent experiences with daughter and husband, assisted patient identify effects of canceling her clients and not working, assisted patient develop plan to resume working, assisted patient develop realistic schedule, assigned patient to implement strategies discussed in session.  Plan: Return again in 2 weeks.   Diagnosis: Axis I: Generalized Anxiety Disorder    Axis II: Deferred    Zakara Parkey, LCSW 10/07/2017

## 2017-10-21 ENCOUNTER — Ambulatory Visit (HOSPITAL_COMMUNITY): Payer: BLUE CROSS/BLUE SHIELD | Admitting: Psychiatry

## 2017-11-04 ENCOUNTER — Encounter (HOSPITAL_COMMUNITY): Payer: Self-pay | Admitting: Psychiatry

## 2017-11-04 ENCOUNTER — Ambulatory Visit (HOSPITAL_COMMUNITY): Payer: BLUE CROSS/BLUE SHIELD | Admitting: Psychiatry

## 2017-11-04 DIAGNOSIS — F411 Generalized anxiety disorder: Secondary | ICD-10-CM

## 2017-11-04 NOTE — Progress Notes (Signed)
                              THERAPIST PROGRESS NOTE  Session Time:  Monday 11/04/2017 11:14 AM -  12:00 PM                                       Participation Level: Active  Behavioral Response: CasualAlert/depressed, tearful  Type of Therapy: Individual Therapy/CBT  Treatment Goals addressed:   1. Learn and implement calming skills to reduce overall anxiety and manage anxiety symptoms.      2. Learn and implement personal and interpersonal skills to reduce anxiety and improve interpersonal relationships.   Interventions: Supportive/CBT  Summary: Gabriela Schultz is a 52 y.o. female who presents with a long standing history of symptoms of anxiety that have been managed well with medication. She experienced increased symptoms when she began having perimenopausal symtoms about six months ago. 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 She reports additional stress regarding change in dynamics in the relationship with her brother-in-law. She is experiencing marital stress related to poor communication as well as husband's alcoholism and controlling behavior per patient's report.   Patient last was seen 2 weeks ago. She reports continued stress, anxiety, and sadness since last session. She reports husband filed for divorce. Patient signed the papers about 2 weeks ago. She expresses sadness and hurt. 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 She also reports daughter made hurtful comments to her about the divorce. This triggered increased negative thoughts about self.  Patient did close on a house last month and reports mixed feelings about this. She reports feeling lost. She has continued to work and has had continued strong support  from her friends.     Suicidal/Homicidal: No  Therapist Response:  Reviewed symptoms, discussed stressors,  facilitated expression of thoughts and feelings, validated feelings, normalized feelings related to grief and loss issues, assisted patient identify pros and cons about leaving the business with her daughter, assisted patient identify/challenge/, and replace unhelpful thoughts about self with helpful thoughts,   Plan: Return again in 2 weeks.   Diagnosis: Axis I: Generalized Anxiety Disorder    Axis II: Deferred    Alima Naser, LCSW 11/04/2017

## 2017-11-18 ENCOUNTER — Ambulatory Visit (HOSPITAL_COMMUNITY): Payer: BLUE CROSS/BLUE SHIELD | Admitting: Psychiatry

## 2017-11-18 DIAGNOSIS — F411 Generalized anxiety disorder: Secondary | ICD-10-CM

## 2017-11-18 NOTE — Progress Notes (Signed)
                              THERAPIST PROGRESS NOTE  Session Time:  Monday 11/18/2017 4:10 PM - 5:00 PM                                          Participation Level: Active  Behavioral Response: CasualAlert/depressed, tearful  Type of Therapy: Individual Therapy/CBT  Treatment Goals addressed:   1. Learn and implement calming skills to reduce overall anxiety and manage anxiety symptoms.      2. Learn and implement personal and interpersonal skills to reduce anxiety and improve interpersonal relationships.   Interventions: Supportive/CBT  Summary: Gabriela Schultz is a 52 y.o. female who presents with a long standing history of symptoms of anxiety that have been managed well with medication. She experienced increased symptoms when she began having perimenopausal symtoms about six months ago. 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 She reports additional stress regarding change in dynamics in the relationship with her brother-in-law. She is experiencing marital stress related to poor communication as well as husband's alcoholism and controlling behavior per patient's report.   Patient last was seen 2 weeks ago. 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 She is pleased about this but still worries she doesn't have dependable income and benefits. She is particularly stressed by this as benefits through husband's employer will be terminated when divorce is final. Patient has begun researching her options for private insurance. She continues to express sadness about dissolution of marriage but also increased acceptance. Patient continues to experience  significant anxiety regarding a variety of issues. continued stress, anxiety, and sadness since last session. She has continued to work and has had continued strong support from her friends.     Suicidal/Homicidal: No  Therapist Response:  Reviewed symptoms, administered PHQ-9, GAD-7, Severity Measure for Generalized Anxiety Disorder, discussed stressors,  facilitated expression of thoughts and feelings, validated feelings, normalized feelings related to grief and loss issues, reviewed treatment plan, discussed next steps for treatment to include using manualized approach for GAD, assisted patient identify relaxation techniques, discussed rationale for regular practice, assisted patient develop plan for daily practice of either "Yo-massage " 15 minutes per day or walking 30 minutes per day, obtained commitment from patient to do homework,  Plan: Return again in 2 weeks.   Diagnosis: Axis I: Generalized Anxiety Disorder    Axis II: Deferred    Akshay Spang, LCSW 11/18/2017

## 2017-12-02 ENCOUNTER — Ambulatory Visit (HOSPITAL_COMMUNITY): Payer: BLUE CROSS/BLUE SHIELD | Admitting: Psychiatry

## 2017-12-27 ENCOUNTER — Ambulatory Visit (HOSPITAL_COMMUNITY): Payer: BLUE CROSS/BLUE SHIELD | Admitting: Psychiatry

## 2017-12-27 DIAGNOSIS — F411 Generalized anxiety disorder: Secondary | ICD-10-CM | POA: Diagnosis not present

## 2017-12-27 NOTE — Progress Notes (Signed)
                              THERAPIST PROGRESS NOTE  Session Time:  Friday 12/27/2017 10:14 AM -  11:06 AM                                   Participation Level: Active  Behavioral Response: CasualAlert/anxious  Type of Therapy: Individual Therapy/CBT  Treatment Goals addressed:   1. Learn and implement calming skills to reduce overall anxiety and manage anxiety symptoms.      2. Learn and implement personal and interpersonal skills to reduce anxiety and improve interpersonal relationships.   Interventions: Supportive/CBT  Summary: Gabriela Schultz is a 52 y.o. female who presents with a long standing history of symptoms of anxiety that have been managed well with medication. She experienced increased symptoms when she began having perimenopausal symtoms about six months ago. 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 She reports additional stress regarding change in dynamics in the relationship with her brother-in-law. She is experiencing marital stress related to poor communication as well as husband's alcoholism and controlling behavior per patient's report.   Patient last was seen about 4 weeks ago. She reports improved mood and improved self-care since last session. 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 She reports she now is enjoying her home. She has been practicing relaxation techniques and exercising regularly. She also has been saying no and setting limits with her clients regarding schedule. She reports continued worry about a variety of issues and reports still avoiding various situations as well as procrastinating.   Suicidal/Homicidal: No  Therapist Response:  Reviewed symptoms, praised and reinforced  patient's improved self-care and use of assertiveness skills, facilitated expression of thoughts and feelings, validated feelings, introduced module "Overview of Generalized Anxiety", provided psychoeducation on causes of generalized anxiety, assisted patient identify factors in her life that have contributed to anxiety, discussed the way patient experiences anxiety, discussed situations patient have avoided due to anxiety and how anxiety has affected her life, assigned patient to review handouts provided in session and complete forms, bring to next session, continue self-care efforts.   Plan: Return again in 2 weeks.   Diagnosis: Axis I: Generalized Anxiety Disorder    Axis II: Deferred    Alleta Avery, LCSW 12/27/2017

## 2018-01-20 ENCOUNTER — Ambulatory Visit (HOSPITAL_COMMUNITY): Payer: BLUE CROSS/BLUE SHIELD | Admitting: Psychiatry

## 2018-01-20 DIAGNOSIS — F411 Generalized anxiety disorder: Secondary | ICD-10-CM | POA: Diagnosis not present

## 2018-01-20 NOTE — Progress Notes (Signed)
                              THERAPIST PROGRESS NOTE  Session Time:  Monday 01/20/2018 4:10 PM - 5:05 PM                                   Participation Level: Active  Behavioral Response: CasualAlert/anxious  Type of Therapy: Individual Therapy/CBT  Treatment Goals addressed:   1. Learn and implement calming skills to reduce overall anxiety and manage anxiety symptoms.      2. Learn and implement personal and interpersonal skills to reduce anxiety and improve interpersonal relationships.   Interventions: Supportive/CBT  Summary: Gabriela Schultz is a 52 y.o. female who presents with a long standing history of symptoms of anxiety that have been managed well with medication. She experienced increased symptoms when she began having perimenopausal symtoms about six months ago. 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 She reports additional stress regarding change in dynamics in the relationship with her brother-in-law. She is experiencing marital stress related to poor communication as well as husband's alcoholism and controlling behavior per patient's report.   Patient last was seen about 4 weeks ago. She reports depressed mood and anxiety. She attributes this to being sick with sinus infection and experiencing fatigue. She has missed days from work and expresses worry about finances. 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 She continues to worry about a variety of issues. 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 She reports a client recently gave her a difficult time but admits having positive comments from most of her  clients.  She reports ex-husband has contacted her and wants to reconcile. She expresses ambivalent feelings and anxiety about this .    Suicidal/Homicidal: No  Therapist Response:  Reviewed symptoms, identified triggers of increased depressed mood,facilitated expression of thoughts and feelings, validated feelings,  discussed ways to increase behavioral activation, discussed worry and assisted patient identify triggers, discussed connection between thoughts/mood/ and behaviorusing examples from patient's life, assisted patient identify/challenge/and replace unhelpful thoughts about competence as a trainer with helpful thoughts  Plan: Return again in 2 weeks.   Diagnosis: Axis I: Generalized Anxiety Disorder    Axis II: Deferred    Riad Wagley, LCSW 01/20/2018

## 2018-02-03 ENCOUNTER — Ambulatory Visit (HOSPITAL_COMMUNITY): Payer: BLUE CROSS/BLUE SHIELD | Admitting: Psychiatry

## 2018-02-17 ENCOUNTER — Ambulatory Visit (HOSPITAL_COMMUNITY): Payer: Self-pay | Admitting: Psychiatry

## 2018-03-03 ENCOUNTER — Encounter (HOSPITAL_COMMUNITY): Payer: Self-pay | Admitting: Psychiatry

## 2018-03-03 ENCOUNTER — Ambulatory Visit (HOSPITAL_COMMUNITY): Payer: BLUE CROSS/BLUE SHIELD | Admitting: Psychiatry

## 2018-03-03 DIAGNOSIS — F411 Generalized anxiety disorder: Secondary | ICD-10-CM | POA: Diagnosis not present

## 2018-03-03 NOTE — Progress Notes (Signed)
                              THERAPIST PROGRESS NOTE  Session Time:   Monday 03/03/2018 4:15 PM - 5:05 PM                                    Participation Level: Active  Behavioral Response: CasualAlert/anxious  Type of Therapy: Individual Therapy/CBT  Treatment Goals addressed:   1. Learn and implement calming skills to reduce overall anxiety and manage anxiety symptoms.      2. Learn and implement personal and interpersonal skills to reduce anxiety and improve interpersonal relationships.   Interventions: Supportive/CBT  Summary: Gabriela Schultz is a 52 y.o. female who presents with a long standing history of symptoms of anxiety that have been managed well with medication. She experienced increased symptoms when she began having perimenopausal symtoms about six months ago. 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 She reports additional stress regarding change in dynamics in the relationship with her brother-in-law. She is experiencing marital stress related to poor communication as well as husband's alcoholism and controlling behavior per patient's report.   Patient last was seen about 4 weeks ago. She reports less depressed mood but continued anxiety. She was experiencing increased pain due to fibromyalgia resulting in down mood about 2 weeks ago but has resumed normal interest and involvement in activities. She has continued job search efforts and has been offered two positions. 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 She continues to have negative thought patterns and reports continued pattern of indecisiveness.    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   Diagnosis: Axis I: Generalized Anxiety Disorder    Axis II: Deferred    Tyreque Finken, LCSW 03/03/2018

## 2018-03-17 ENCOUNTER — Ambulatory Visit (HOSPITAL_COMMUNITY): Payer: Self-pay | Admitting: Psychiatry

## 2018-03-31 ENCOUNTER — Ambulatory Visit (HOSPITAL_COMMUNITY): Payer: Self-pay | Admitting: Psychiatry

## 2018-04-16 IMAGING — DX DG ABDOMEN ACUTE W/ 1V CHEST
3 series · 3 of 3 positions shown · non-contrast
Comparison: Chest x-ray 04/06/2015

CLINICAL DATA: Vomiting and diarrhea since [REDACTED] night.

EXAM:
DG ABDOMEN ACUTE W/ 1V CHEST

[chest pa]
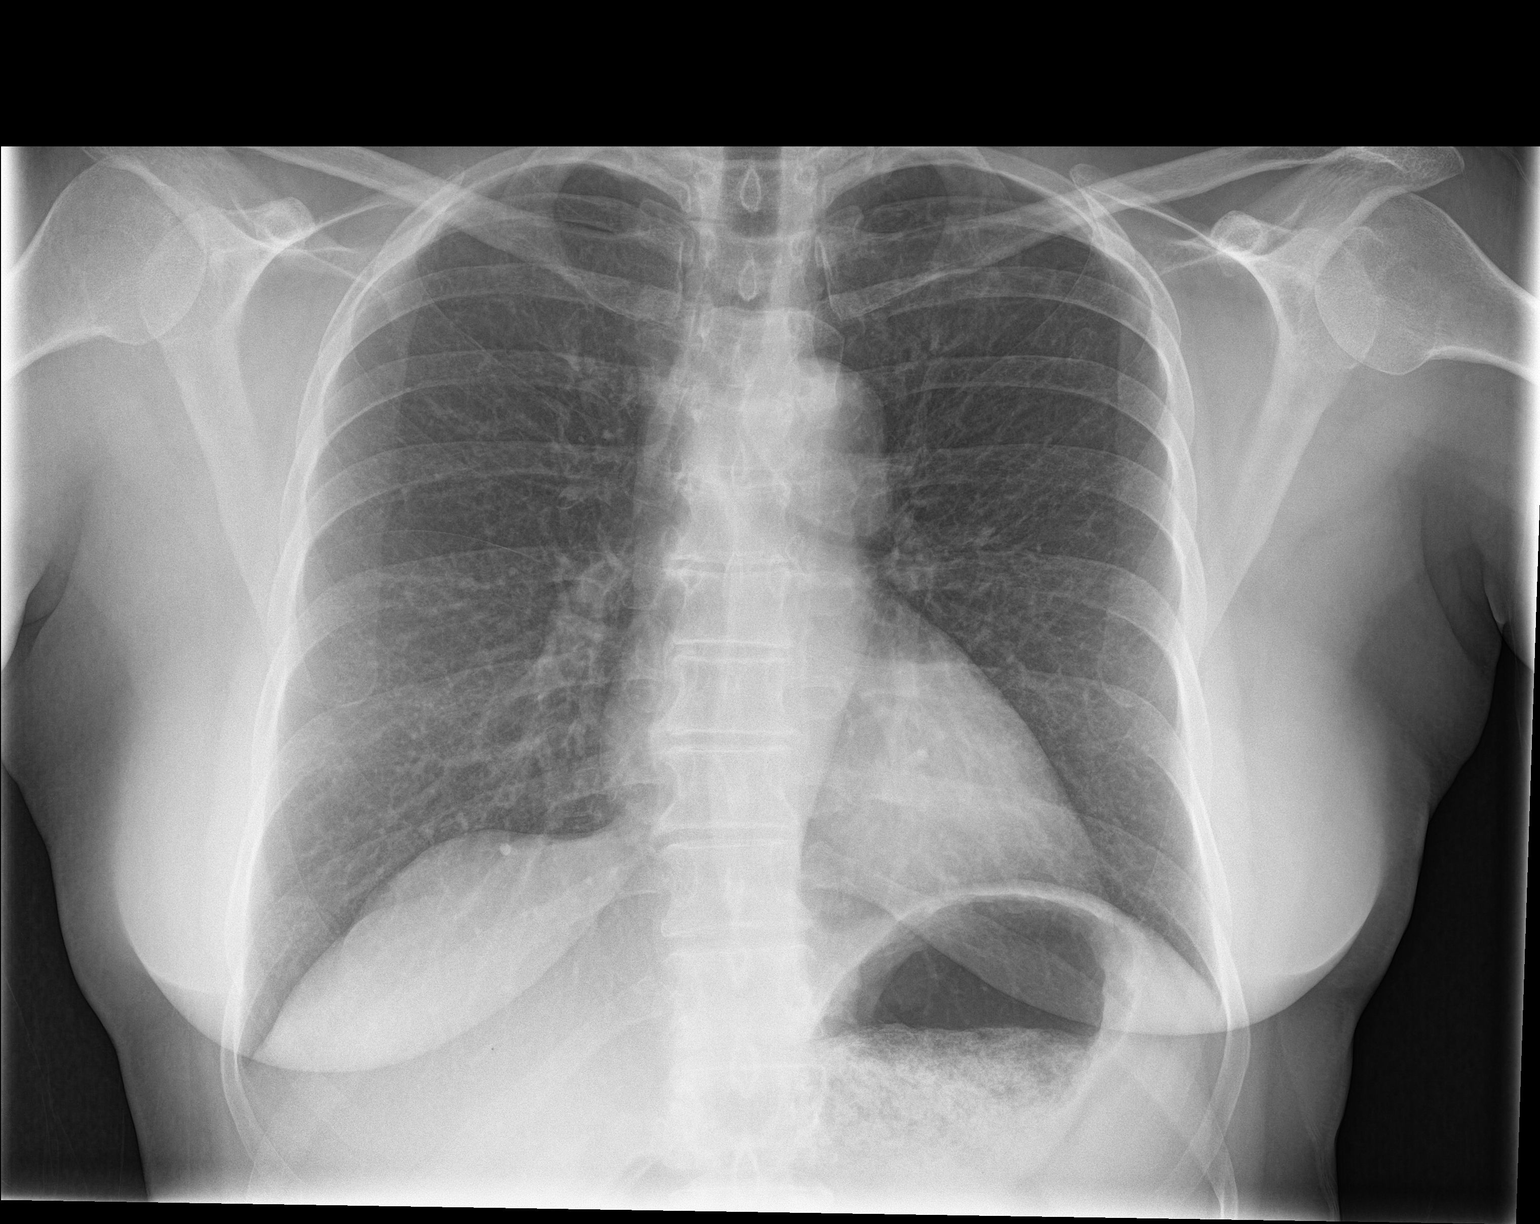

[abdomen erect]
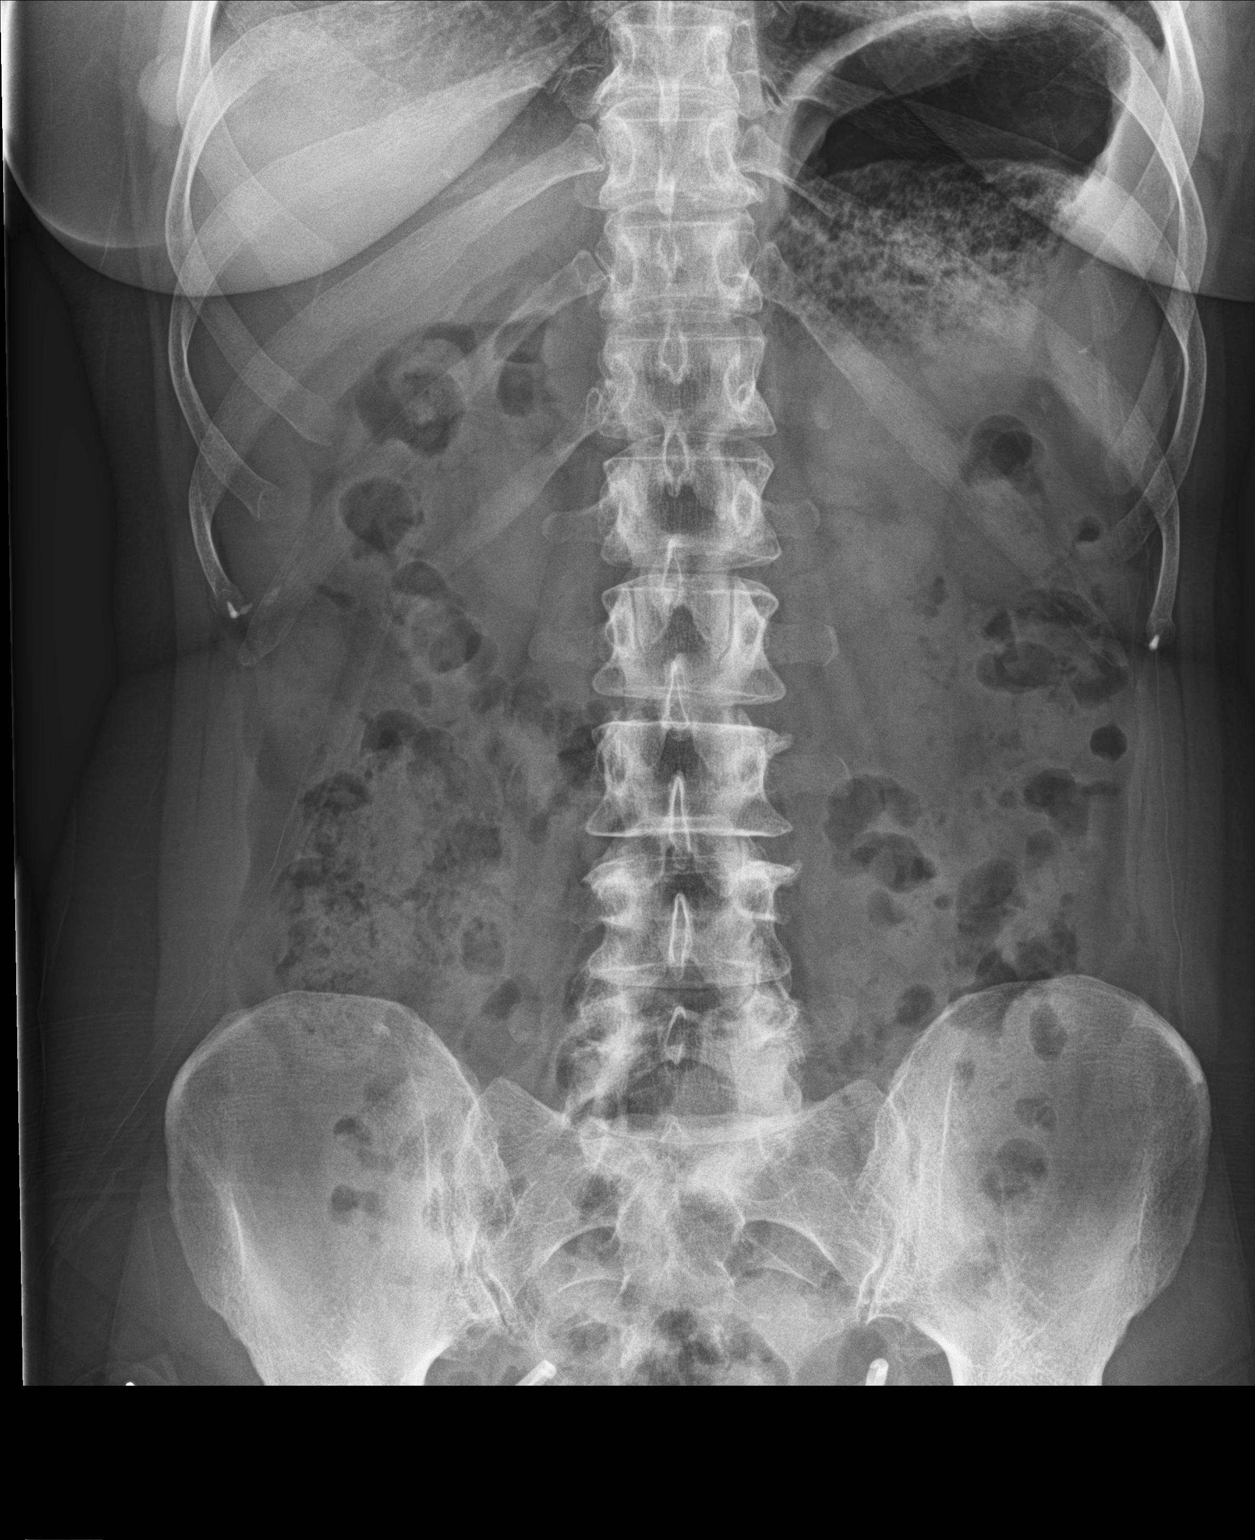

[abdomen supine]
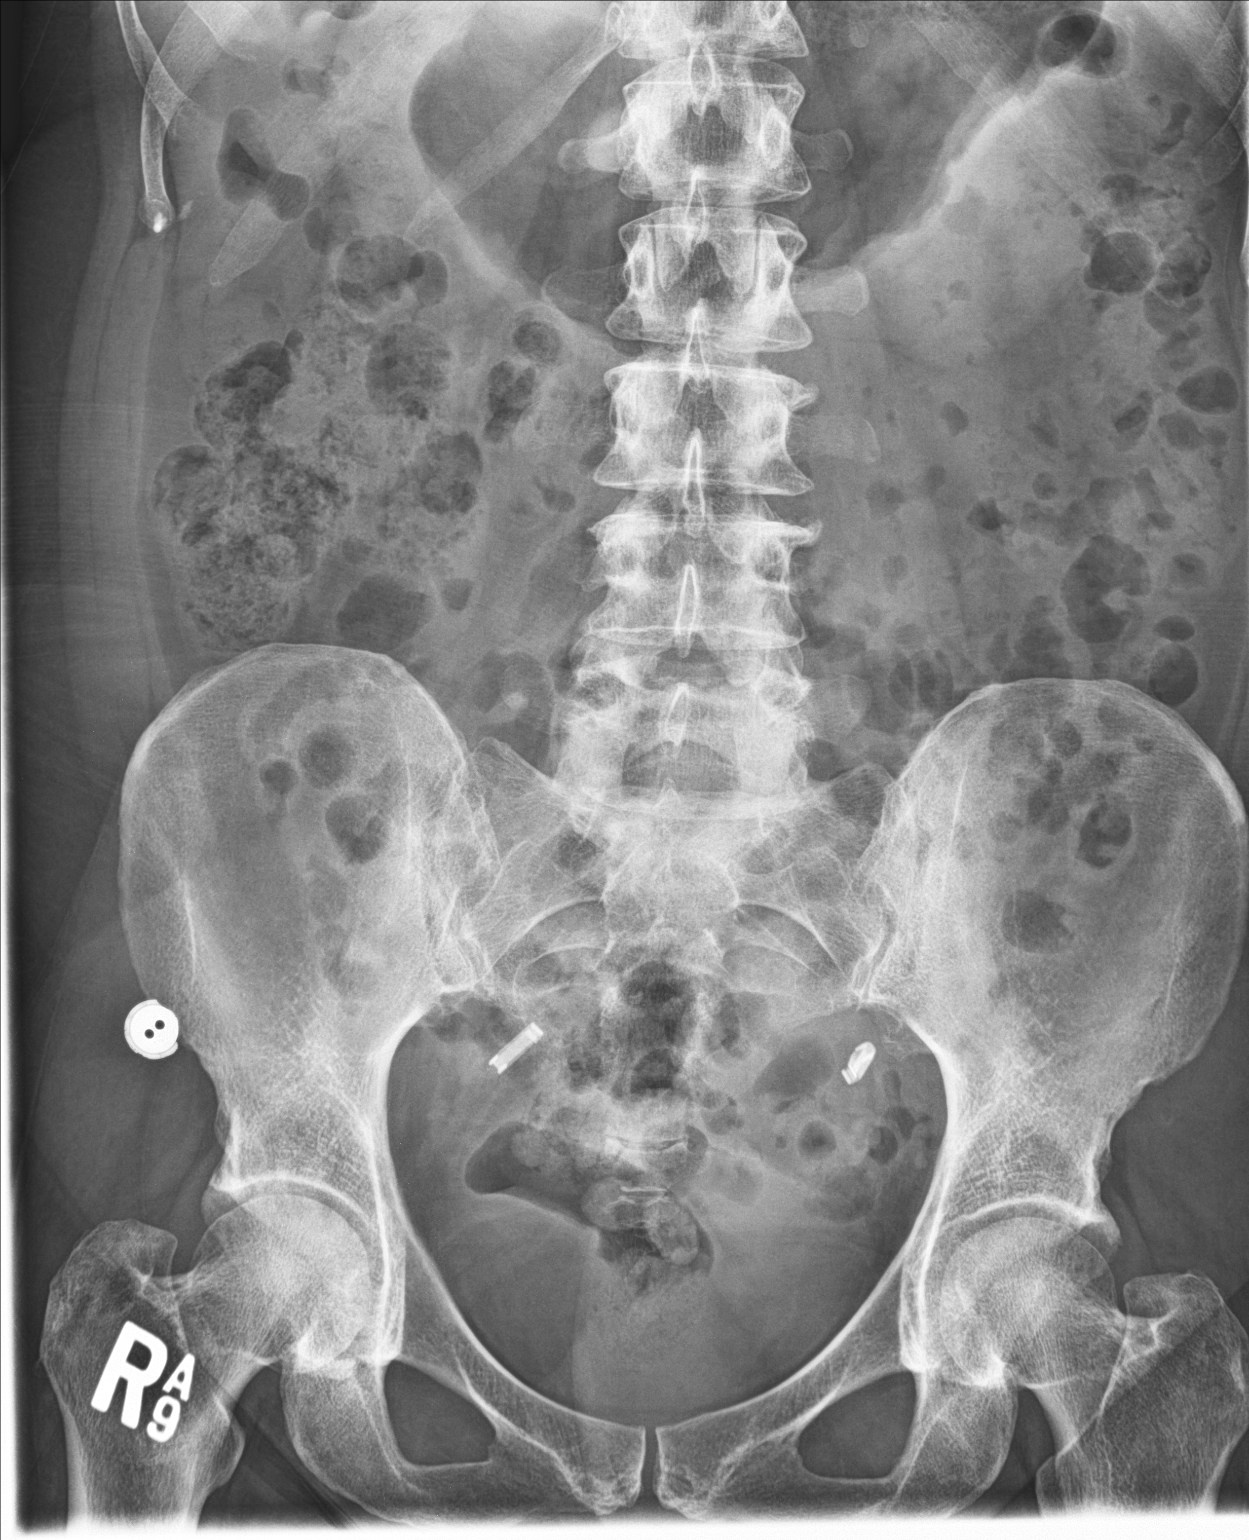

[3 of 3 positions shown; findings below may reference images not displayed]

FINDINGS: The upright chest x-ray is normal. The heart and mediastinal
contours are normal in stable. The lungs are clear. No pleural
effusion.

Two views of the abdomen demonstrate air fluid and fluid level in
the stomach. There is scattered air and stool in the colon and air
throughout the small bowel which is not dilated. No findings for
small bowel obstruction or free air. Findings could be due to
gastroenteritis. The soft tissue shadows of the abdomen are
maintained. No worrisome calcifications. Bilateral tubal ligation
clips are noted in the pelvis. The bony structures are unremarkable.
IMPRESSION: Normal chest x-ray.

Nonspecific bowel gas pattern. Possible gastroenteritis but no
findings for obstruction or perforation.

## 2019-03-26 ENCOUNTER — Ambulatory Visit (INDEPENDENT_AMBULATORY_CARE_PROVIDER_SITE_OTHER): Payer: BC Managed Care – PPO | Admitting: Psychiatry

## 2019-03-26 ENCOUNTER — Other Ambulatory Visit: Payer: Self-pay

## 2019-03-26 DIAGNOSIS — F411 Generalized anxiety disorder: Secondary | ICD-10-CM
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
## 2019-03-27 ENCOUNTER — Encounter (HOSPITAL_COMMUNITY): Payer: Self-pay | Admitting: Psychiatry

## 2019-06-01 ENCOUNTER — Other Ambulatory Visit: Payer: Self-pay

## 2019-06-01 ENCOUNTER — Ambulatory Visit: Payer: BC Managed Care – PPO | Attending: Internal Medicine

## 2019-06-01 DIAGNOSIS — Z20822 Contact with and (suspected) exposure to covid-19: Secondary | ICD-10-CM

## 2019-06-02 LAB — NOVEL CORONAVIRUS, NAA: SARS-CoV-2, NAA: DETECTED — AB

## 2019-06-03 NOTE — Progress Notes (Signed)
Your test for COVID-19 was positive ("detected"), meaning that you were infected with the novel coronavirus and could give the germ to others.    Please continue isolation at home, for at least 10 days since the start of your fever/cough/breathlessness and until you have had 24 hours without fever (without taking a fever reducer) and with any cough/breathlessness improving. Use over-the-counter medications for symptoms.  If you have had no symptoms, but were exposed to someone who was positive for COVID-19, you will need to quarantine and self-isolate for 14 days from the date of exposure.    Please continue good preventive care measures, including:  frequent hand-washing, avoid touching your face, cover coughs/sneezes, stay out of crowds and keep a 6 foot distance from others.  Clean hard surfaces touched frequently with disinfectant cleaning products.   Please check in with your primary care provider about your positive test result.  Go to the nearest urgent care or ED for assessment if you have severe breathlessness or severe weakness/fatigue (ex needing new help getting out of bed or to the bathroom).  Members of your household will also need to quarantine for 14 days from the date of your positive test.You may also be contacted by the health department for follow up. Please call [place] at [phone] if you have any questions or concerns.     

## 2019-06-11 ENCOUNTER — Other Ambulatory Visit: Payer: BC Managed Care – PPO

## 2019-06-12 ENCOUNTER — Ambulatory Visit: Payer: BC Managed Care – PPO | Attending: Internal Medicine

## 2019-06-12 ENCOUNTER — Other Ambulatory Visit: Payer: Self-pay

## 2019-06-12 DIAGNOSIS — Z20822 Contact with and (suspected) exposure to covid-19: Secondary | ICD-10-CM

## 2019-06-14 LAB — NOVEL CORONAVIRUS, NAA: SARS-CoV-2, NAA: NOT DETECTED

## 2019-06-15 ENCOUNTER — Other Ambulatory Visit: Payer: BC Managed Care – PPO

## 2019-07-27 ENCOUNTER — Other Ambulatory Visit: Payer: Self-pay

## 2019-07-27 ENCOUNTER — Ambulatory Visit
Admission: RE | Admit: 2019-07-27 | Discharge: 2019-07-27 | Disposition: A | Discharge status: Home / Self Care | Payer: BC Managed Care – PPO | Source: Ambulatory Visit | Attending: Internal Medicine | Admitting: Internal Medicine

## 2019-07-27 ENCOUNTER — Other Ambulatory Visit: Payer: Self-pay | Admitting: Internal Medicine

## 2019-07-27 DIAGNOSIS — Z1231 Encounter for screening mammogram for malignant neoplasm of breast: Secondary | ICD-10-CM

## 2019-12-01 DIAGNOSIS — R030 Elevated blood-pressure reading, without diagnosis of hypertension: Secondary | ICD-10-CM | POA: Diagnosis not present

## 2019-12-01 DIAGNOSIS — Z131 Encounter for screening for diabetes mellitus: Secondary | ICD-10-CM | POA: Diagnosis not present

## 2019-12-01 DIAGNOSIS — E663 Overweight: Secondary | ICD-10-CM | POA: Diagnosis not present

## 2019-12-01 DIAGNOSIS — Z13220 Encounter for screening for lipoid disorders: Secondary | ICD-10-CM | POA: Diagnosis not present

## 2019-12-01 DIAGNOSIS — F419 Anxiety disorder, unspecified: Secondary | ICD-10-CM | POA: Diagnosis not present

## 2019-12-01 DIAGNOSIS — R350 Frequency of micturition: Secondary | ICD-10-CM | POA: Diagnosis not present

## 2019-12-01 DIAGNOSIS — Z1211 Encounter for screening for malignant neoplasm of colon: Secondary | ICD-10-CM | POA: Diagnosis not present

## 2019-12-03 DIAGNOSIS — Z23 Encounter for immunization: Secondary | ICD-10-CM | POA: Diagnosis not present

## 2019-12-03 DIAGNOSIS — Z1211 Encounter for screening for malignant neoplasm of colon: Secondary | ICD-10-CM | POA: Diagnosis not present

## 2019-12-03 DIAGNOSIS — Z0000 Encounter for general adult medical examination without abnormal findings: Secondary | ICD-10-CM | POA: Diagnosis not present

## 2019-12-07 ENCOUNTER — Ambulatory Visit (HOSPITAL_COMMUNITY): Payer: BC Managed Care – PPO | Admitting: Psychiatry

## 2019-12-07 ENCOUNTER — Other Ambulatory Visit: Payer: Self-pay

## 2019-12-29 DIAGNOSIS — S62525D Nondisplaced fracture of distal phalanx of left thumb, subsequent encounter for fracture with routine healing: Secondary | ICD-10-CM | POA: Diagnosis not present

## 2020-01-05 DIAGNOSIS — S62525D Nondisplaced fracture of distal phalanx of left thumb, subsequent encounter for fracture with routine healing: Secondary | ICD-10-CM | POA: Diagnosis not present

## 2020-01-12 DIAGNOSIS — S62525D Nondisplaced fracture of distal phalanx of left thumb, subsequent encounter for fracture with routine healing: Secondary | ICD-10-CM | POA: Diagnosis not present

## 2020-01-26 DIAGNOSIS — S62525D Nondisplaced fracture of distal phalanx of left thumb, subsequent encounter for fracture with routine healing: Secondary | ICD-10-CM | POA: Diagnosis not present

## 2020-02-08 DIAGNOSIS — R3989 Other symptoms and signs involving the genitourinary system: Secondary | ICD-10-CM | POA: Diagnosis not present

## 2020-02-09 DIAGNOSIS — S62525D Nondisplaced fracture of distal phalanx of left thumb, subsequent encounter for fracture with routine healing: Secondary | ICD-10-CM | POA: Diagnosis not present

## 2020-03-01 DIAGNOSIS — S62525D Nondisplaced fracture of distal phalanx of left thumb, subsequent encounter for fracture with routine healing: Secondary | ICD-10-CM | POA: Diagnosis not present

## 2020-03-24 DIAGNOSIS — S62525D Nondisplaced fracture of distal phalanx of left thumb, subsequent encounter for fracture with routine healing: Secondary | ICD-10-CM | POA: Diagnosis not present

## 2020-06-06 DIAGNOSIS — R7303 Prediabetes: Secondary | ICD-10-CM | POA: Diagnosis not present

## 2020-07-26 DIAGNOSIS — F422 Mixed obsessional thoughts and acts: Secondary | ICD-10-CM | POA: Diagnosis not present

## 2020-08-22 ENCOUNTER — Other Ambulatory Visit: Payer: Self-pay

## 2020-08-22 ENCOUNTER — Ambulatory Visit (INDEPENDENT_AMBULATORY_CARE_PROVIDER_SITE_OTHER): Payer: BC Managed Care – PPO | Admitting: Psychiatry

## 2020-08-22 ENCOUNTER — Encounter (HOSPITAL_COMMUNITY): Payer: Self-pay | Admitting: Psychiatry

## 2020-08-22 DIAGNOSIS — F411 Generalized anxiety disorder: Secondary | ICD-10-CM | POA: Diagnosis not present
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
## 2020-09-06 ENCOUNTER — Ambulatory Visit (HOSPITAL_COMMUNITY): Payer: BC Managed Care – PPO | Admitting: Psychiatry

## 2020-09-06 ENCOUNTER — Other Ambulatory Visit: Payer: Self-pay

## 2020-09-06 ENCOUNTER — Telehealth (HOSPITAL_COMMUNITY): Payer: Self-pay | Admitting: Psychiatry

## 2020-09-06 NOTE — Telephone Encounter (Signed)
Therapist attempted to contact patient twice via text through caregility platform for scheduled appointment, no response.  Therapist called patient, left message indicating attempt, and requesting patient call office. 

## 2020-09-20 ENCOUNTER — Other Ambulatory Visit: Payer: Self-pay

## 2020-09-20 ENCOUNTER — Ambulatory Visit (INDEPENDENT_AMBULATORY_CARE_PROVIDER_SITE_OTHER): Payer: BC Managed Care – PPO | Admitting: Psychiatry

## 2020-09-20 DIAGNOSIS — F411 Generalized anxiety disorder: Secondary | ICD-10-CM | POA: Diagnosis not present

## 2020-09-20 NOTE — Progress Notes (Signed)
Virtual Visit via Video Note  I connected with Foy Guadalajara on 09/20/20 at 3:07 PM EDT  by a video enabled telemedicine application and verified that I am speaking with the correct person using two identifiers.  Location: Patient: Car Provider: [facility] [place] office    I discussed the limitations of evaluation and management by telemedicine and the availability of in person appointments. The patient expressed understanding and agreed to proceed.  I provided 48 minutes of non-face-to-face time during this encounter.   Adah Salvage, LCSW    THERAPIST PROGRESS NOTE  Session Time: Tuesday 09/20/2020 3:07 PM - 3:55 PM  Participation Level: Active  Behavioral Response: CasualAlertAnxious  Type of Therapy: Individual Therapy  Treatment Goals addressed: Ability to handle effectively the full variety of life's anxieties AEB patient learning and implementing conflict resolution skills to resolve interpersonal problems/express needs and concerns assertively to ex-husband per patient's report patient indecisive about choices regarding medications in restaurants per patient's report   Interventions: CBT and Supportive  Summary: MEHER KUCINSKI is a 55 y.o. female who presents with a longstanding history of symptoms of anxiety and depression.  She is a returning patient to this clinician as she has been in and treatment intermittently since 2014.  Current stressors include mother having dementia that was exacerbated by COVID-19.  Patient reports trying to move her mother and father to a skilled nursing facility.  They are currently living with her sister with whom patient reports a tense relationship.  They recently had conflict and patient has not seen her mother since that time.  Patient states feeling as though she lost her parents when they moved in with her sister.  Patient last was seen via virtual visit about 4 weeks ago.  She reports continued stress and anxiety regarding  her parents.  Per patient's report, her mother recently was placed in a skilled nursing facility.  Her mother suffered a fall this past weekend and is in the hospital.  Patient reports she has seen mother since last session but says mother does not recognize her.  Patient expresses frustration about mother's current placement but reports having no voice as her sister has POA.  She presses resentment and anger but also states she has already lost her parents.  She reports still being involved with her ex-husband and and states feeling like she does not have a voice in that relationship as well.  Patient reports thoughts of her thoughts and feelings do not matter.  However, she expresses a desire to change her pattern of interaction with her ex husband.  Suicidal/Homicidal: Nowithout intent/plan  Therapist Response: Reviewed symptoms, discussed stressors, facilitated expression of thoughts and feelings, validated feelings, developed treatment plan, obtained patient's permission to initial plan for patient as this was a virtual visit, began to assist patient examine her pattern of interaction in the relationship with her sister as well as her ex-husband, assisted patient began to identify the effects on her behavior and functioning, developed plan with patient to complete assertiveness inventory in preparation for next session  Plan: Return again in 2 weeks.  Diagnosis: Axis I: Generalized Anxiety Disorder        Adah Salvage, LCSW 09/20/2020

## 2020-10-04 ENCOUNTER — Other Ambulatory Visit: Payer: Self-pay

## 2020-10-04 ENCOUNTER — Ambulatory Visit (INDEPENDENT_AMBULATORY_CARE_PROVIDER_SITE_OTHER): Payer: BC Managed Care – PPO | Admitting: Psychiatry

## 2020-10-04 DIAGNOSIS — F411 Generalized anxiety disorder: Secondary | ICD-10-CM | POA: Diagnosis not present

## 2020-10-04 NOTE — Progress Notes (Signed)
Virtual Visit via Video Note  I connected with Gabriela Schultz on 10/04/20 at 3:11 PM EDT  by a video enabled telemedicine application and verified that I am speaking with the correct person using two identifiers.  Location: Patient: Home Provider: [facility] [place] office    I discussed the limitations of evaluation and management by telemedicine and the availability of in person appointments. The patient expressed understanding and agreed to proceed.  I provided 48 minutes of non-face-to-face time during this encounter.   Adah Salvage, LCSW    THERAPIST PROGRESS NOTE  Session Time: Tuesday 10/04/2020 3:11 PM - 3:59 PM   Participation Level: Active  Behavioral Response: CasualAlertAnxious  Type of Therapy: Individual Therapy  Treatment Goals addressed: Ability to handle effectively the full variety of life's anxieties AEB patient learning and implementing conflict resolution skills to resolve interpersonal problems/express needs and concerns assertively to ex-husband per patient's report patient indecisive about choices regarding medications in restaurants per patient's report   Interventions: CBT and Supportive  Summary: Gabriela Schultz is a 55 y.o. female who presents with a longstanding history of symptoms of anxiety and depression.  She is a returning patient to this clinician as she has been in and treatment intermittently since 2014.  Current stressors include mother having dementia that was exacerbated by COVID-19.  Patient reports trying to move her mother and father to a skilled nursing facility.  They are currently living with her sister with whom patient reports a tense relationship.  They recently had conflict and patient has not seen her mother since that time.  Patient states feeling as though she lost her parents when they moved in with her sister.  Patient last was seen via virtual visit about 2weeks ago.  She reports less stress and anxiety since last  session.  Per patient's report, she is trying to have more balance in her life between involvement in pleasurable activities and working.  She enjoyed a recent 4-day trip to the beach.  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  She also continues to report difficulty setting and maintaining limits with her ex-husband.  However, she is pleased she was assertive about making choices about attending restaurants while they were on vacation.  She reports being nervous about doing this but feeling positive that she did.  She reports improved communication with sister regarding her parents sister has been initiating text and contact with patient to keep her informed.    Suicidal/Homicidal: Nowithout intent/plan  Therapist Response: Reviewed symptoms, patient reinforced patient's efforts to achieve balance, praised and reinforced patient's efforts to be more assertive in the relationship with ex-husband, reviewed assertiveness inventory, assisted patient examine areas that cause the most difficulty regarding being assertive, assisted patient identify her thought patterns regarding being assertive, assisted patient identify the connection between her thoughts and nonassertive behavior, developed plan with patient to review handout on assertiveness skills in preparation for next session Plan: Return again in 2 weeks.  Diagnosis: Axis I: Generalized Anxiety Disorder        Adah Salvage, LCSW 10/04/2020

## 2020-10-05 DIAGNOSIS — Z01419 Encounter for gynecological examination (general) (routine) without abnormal findings: Secondary | ICD-10-CM | POA: Diagnosis not present

## 2020-10-18 DIAGNOSIS — R102 Pelvic and perineal pain: Secondary | ICD-10-CM | POA: Diagnosis not present

## 2020-12-19 DIAGNOSIS — R7303 Prediabetes: Secondary | ICD-10-CM | POA: Diagnosis not present

## 2020-12-19 DIAGNOSIS — Z0000 Encounter for general adult medical examination without abnormal findings: Secondary | ICD-10-CM | POA: Diagnosis not present

## 2020-12-19 DIAGNOSIS — Z13220 Encounter for screening for lipoid disorders: Secondary | ICD-10-CM | POA: Diagnosis not present

## 2021-01-19 DIAGNOSIS — F422 Mixed obsessional thoughts and acts: Secondary | ICD-10-CM | POA: Diagnosis not present

## 2021-02-24 ENCOUNTER — Ambulatory Visit
Admission: EM | Admit: 2021-02-24 | Discharge: 2021-02-24 | Disposition: A | Discharge status: Home / Self Care | Payer: BC Managed Care – PPO | Attending: Family Medicine | Admitting: Family Medicine

## 2021-02-24 ENCOUNTER — Other Ambulatory Visit: Payer: Self-pay

## 2021-02-24 ENCOUNTER — Encounter: Payer: Self-pay | Admitting: Emergency Medicine

## 2021-02-24 DIAGNOSIS — J069 Acute upper respiratory infection, unspecified: Secondary | ICD-10-CM

## 2021-02-24 MED ORDER — PREDNISONE 20 MG PO TABS: 40.0000 mg | ORAL_TABLET | Freq: Every day | ORAL | 0 refills | Status: DC

## 2021-02-24 NOTE — ED Provider Notes (Signed)
[facility]    CSN: [id number] Arrival date & time: 02/24/21  1004      History   Chief Complaint No chief complaint on file.   HPI Gabriela Schultz is a 55 y.o. female.   Patient presenting today with 5-day history of cough, nasal congestion, sinus pain and pressure, ear pressure, popping worse in the right.  States she feels off balance since the ear has been bothering her.  Denies fever, chills, chest pain, shortness of breath but has had body aches.  So far trying over-the-counter DayQuil, NyQuil, antihistamines with minimal temporary relief.  Multiple sick contacts recently.  History of seasonal allergies.   Past Medical History:  Diagnosis Date   Degenerative disc disease, cervical June 2017   Eating disorder    Fibromyalgia    Headache(784.0)    Neck pain     There are no problems to display for this patient.   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      Home Medications    Prior to Admission medications   Medication Sig Start Date End Date Taking? Authorizing Provider  predniSONE (DELTASONE) 20 MG tablet Take 2 tablets (40 mg total) by mouth daily with breakfast. 02/24/21  Yes Particia Nearing, PA-C  ALPRAZolam Prudy Feeler) 1 MG tablet Take 0.25 mg by mouth daily as needed for anxiety. For anxiety    [provider]  amphetamine-dextroamphetamine (ADDERALL) 5 MG tablet Take 5 mg by mouth daily. Patient not taking: Reported on 08/22/2020    [provider]  aspirin-acetaminophen-caffeine (EXCEDRIN MIGRAINE) [phone] MG per tablet Take 2 tablets by mouth every 6 (six) hours as needed for headache. Reported on 11/14/2015 Patient not taking: Reported on 08/22/2020    [provider]  calcium carbonate (TUMS - DOSED IN MG ELEMENTAL CALCIUM) 500 MG chewable tablet Chew 1 tablet by mouth  daily as needed for heartburn. Reported on 11/14/2015    [provider]  cetirizine (ZYRTEC) 10 MG tablet Take 10 mg by mouth daily.    [provider]  Conj Estrogens-Bazedoxifene 0.45-20 MG TABS Take by mouth. Patient not taking: Reported on 08/22/2020    [provider]  Esomeprazole Magnesium (NEXIUM PO) Take by mouth. Patient not taking: Reported on 08/22/2020    [provider]  FLUoxetine (PROZAC) 20 MG capsule Take 60 mg by mouth at bedtime.    [provider]  fluticasone (FLONASE) 50 MCG/ACT nasal spray Place 2 sprays into both nostrils daily as needed for allergies.  11/09/14   [provider]  gabapentin (NEURONTIN) 300 MG capsule Take 300 mg by mouth 3 (three) times daily. Patient not taking: Reported on 08/22/2020    [provider]  meloxicam (MOBIC) 7.5 MG tablet Take 7.5 mg by mouth daily. Patient not taking: Reported on 08/22/2020    [provider]  Multiple Vitamin (MULTIVITAMIN WITH MINERALS) TABS tablet Take 1 tablet by mouth daily.    [provider]  NAPROXEN PO Take by mouth. Patient not taking: Reported on 08/22/2020    [provider]  omeprazole (PRILOSEC) 10 MG capsule Take 10 mg by mouth daily.    [provider]  ondansetron (ZOFRAN ODT) 4 MG disintegrating tablet Take 1 tablet (4 mg total) by mouth every 8 (eight) hours as needed for  nausea or vomiting. Patient not taking: No sig reported 08/20/16   Long, Arlyss Repress, MD  pantoprazole (PROTONIX) 40 MG tablet Take 1 or 2 po Q 6hrs for pain Patient not taking: Reported on 08/22/2020 09/12/12   Devoria Albe, MD    Family History Family History  Problem Relation Age of Onset   Anxiety disorder Mother    Anxiety disorder Maternal Aunt    Depression Maternal Grandmother    Depression Paternal Grandmother    Anxiety disorder Daughter     Social History Social History   Tobacco Use   Smoking status: Never   Smokeless  tobacco: Never  Substance Use Topics   Alcohol use: Yes    Comment: 1/2 glass of wine per month   Drug use: No     Allergies   Benadryl [diphenhydramine], Doxycycline, Erythromycin, Flagyl [metronidazole], Latex, and Vicodin [hydrocodone-acetaminophen]   Review of Systems Review of Systems Per HPI  Physical Exam Triage Vital Signs ED Triage Vitals  Enc Vitals Group     BP 02/24/21 1238 (!) 167/101     Pulse Rate 02/24/21 1238 97     Resp 02/24/21 1238 18     Temp 02/24/21 1238 98.2 F (36.8 C)     Temp Source 02/24/21 1238 Oral     SpO2 02/24/21 1238 98 %     Weight --      Height --      Head Circumference --      Peak Flow --      Pain Score 02/24/21 1141 0     Pain Loc --      Pain Edu? --      Excl. in GC? --    No data found.  Updated Vital Signs BP (!) 167/101 (BP Location: Right Arm)   Pulse 97   Temp 98.2 F (36.8 C) (Oral)   Resp 18   LMP 04/20/2015   SpO2 98%   Visual Acuity Right Eye Distance:   Left Eye Distance:   Bilateral Distance:    Right Eye Near:   Left Eye Near:    Bilateral Near:     Physical Exam Vitals and nursing note reviewed.  Constitutional:      Appearance: Normal appearance. She is not ill-appearing.  HENT:     Head: Atraumatic.     Nose: Congestion and rhinorrhea present.     Mouth/Throat:     Mouth: Mucous membranes are moist.     Pharynx: Oropharynx is clear. Posterior oropharyngeal erythema present.  Eyes:     Extraocular Movements: Extraocular movements intact.     Conjunctiva/sclera: Conjunctivae normal.  Cardiovascular:     Rate and Rhythm: Normal rate and regular rhythm.     Heart sounds: Normal heart sounds.  Pulmonary:     Effort: Pulmonary effort is normal. No respiratory distress.     Breath sounds: Normal breath sounds. No wheezing or rales.  Musculoskeletal:        General: Normal range of motion.     Cervical back: Normal range of motion and neck supple.  Skin:    General: Skin is warm and dry.   Neurological:     Mental Status: She is alert and oriented to person, place, and time.  Psychiatric:        Mood and Affect: Mood normal.        Thought Content: Thought content normal.        Judgment: Judgment normal.     UC Treatments /  Results  Labs (all labs ordered are listed, but only abnormal results are displayed) Labs Reviewed  COVID-19, FLU A+B NAA    EKG   Radiology No results found.  Procedures Procedures (including critical care time)  Medications Ordered in UC Medications - No data to display  Initial Impression / Assessment and Plan / UC Course  I have reviewed the triage vital signs and the nursing notes.  Pertinent labs & imaging results that were available during my care of the patient were reviewed by me and considered in my medical decision making (see chart for details).     Suspect viral illness causing symptoms, will treat with prednisone for eustachian tube dysfunction, viral sinusitis and await COVID and flu testing.  Quarantine protocol reviewed, work note given.  Supportive over-the-counter medications and home care reviewed.  Return for acutely worsening symptoms.  Final Clinical Impressions(s) / UC Diagnoses   Final diagnoses:  Viral URI with cough   Discharge Instructions   None    ED Prescriptions     Medication Sig Dispense Auth. Provider   predniSONE (DELTASONE) 20 MG tablet Take 2 tablets (40 mg total) by mouth daily with breakfast. 10 tablet Particia Nearing, [place]      PDMP not reviewed this encounter.   Particia Nearing, [place] 02/24/21 1352

## 2021-02-24 NOTE — ED Triage Notes (Signed)
Cough, runny nose, nasal congestion since Tuesday.  Right eye and right ear few funny.  Feels like balance is off.  Has been using over the counter products with out relief.

## 2021-02-25 LAB — COVID-19, FLU A+B NAA
Influenza A, NAA: NOT DETECTED
Influenza B, NAA: NOT DETECTED
SARS-CoV-2, NAA: DETECTED — AB

## 2021-05-11 ENCOUNTER — Ambulatory Visit
Admission: EM | Admit: 2021-05-11 | Discharge: 2021-05-11 | Disposition: A | Discharge status: Home / Self Care | Payer: 59 | Attending: Internal Medicine | Admitting: Internal Medicine

## 2021-05-11 ENCOUNTER — Other Ambulatory Visit: Payer: Self-pay

## 2021-05-11 DIAGNOSIS — S0101XA Laceration without foreign body of scalp, initial encounter: Secondary | ICD-10-CM

## 2021-05-11 DIAGNOSIS — R030 Elevated blood-pressure reading, without diagnosis of hypertension: Secondary | ICD-10-CM

## 2021-05-11 NOTE — ED Triage Notes (Signed)
Patient states that last night she was at the store and tried to get a can off of the shelf but it fell on he head and cut her head.   Patient states she is having headaches but it feels like a sinus headache.   Denies Fever

## 2021-05-11 NOTE — Discharge Instructions (Addendum)
No indication for suturing at this time Okay to wash hair with mild soap and water If he notices any worsening pain, redness, discharge please return to urgent care to be reevaluated If you have worsening global headache, blurry vision, nausea, vomiting and confusion please go to the emergency room to be evaluated for concussion. At this time, you do not have any signs or symptoms of a concussion.

## 2021-05-12 NOTE — ED Provider Notes (Addendum)
[facility]    CSN: [id number] Arrival date & time: 05/11/21  1532      History   Chief Complaint Chief Complaint  Patient presents with   Head Injury    HPI Gabriela Schultz is a 56 y.o. female care with scalp pain of 1 day duration.  Patient sustained injury to her left when a can of cat food hit her head yesterday.  Patient was shopping in [employer] when it happened.  Patient did not pass out.  No blurry vision.  She sustained a superficial laceration over the scalp in the left frontal scalp area.  Bleeding is controlled.  He currently has scalp pain in the left frontotemporal area.  No blurry vision, nausea or vomiting.  No dizziness.  No confusion.  Patient's blood pressure is elevated.  She has a history of whitecoat hypertension.  She denies any chest pain or abdominal pain.  No chest pressure.  HPI  Past Medical History:  Diagnosis Date   Degenerative disc disease, cervical June 2017   Eating disorder    Fibromyalgia    Headache(784.0)    Neck pain     There are no problems to display for this patient.   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      Home Medications    Prior to Admission medications   Medication Sig Start Date End Date Taking? Authorizing Provider  ALPRAZolam Prudy Feeler) 1 MG tablet Take 0.25 mg by mouth daily as needed for anxiety. For anxiety    [provider]  calcium carbonate (TUMS - DOSED IN MG ELEMENTAL CALCIUM) 500 MG chewable tablet Chew 1 tablet by mouth daily as needed for heartburn. Reported on 11/14/2015    [provider]  cetirizine (ZYRTEC) 10 MG tablet Take 10 mg by mouth daily.    [provider]  FLUoxetine (PROZAC) 20 MG capsule Take 60 mg by mouth at bedtime.    [provider]  fluticasone (FLONASE) 50 MCG/ACT nasal spray Place 2  sprays into both nostrils daily as needed for allergies.  11/09/14   [provider]  Multiple Vitamin (MULTIVITAMIN WITH MINERALS) TABS tablet Take 1 tablet by mouth daily.    [provider]  omeprazole (PRILOSEC) 10 MG capsule Take 10 mg by mouth daily.    [provider]    Family History Family History  Problem Relation Age of Onset   Anxiety disorder Mother    Anxiety disorder Maternal Aunt    Depression Maternal Grandmother    Depression Paternal Grandmother    Anxiety disorder Daughter     Social History Social History   Tobacco Use   Smoking status: Never   Smokeless tobacco: Never  Vaping Use   Vaping Use: Never used  Substance Use Topics   Alcohol use: Yes    Comment: 1/2 glass of wine per month   Drug use: No     Allergies   Benadryl [diphenhydramine], Doxycycline, Erythromycin, Flagyl [metronidazole], Latex, and Vicodin [hydrocodone-acetaminophen]   Review of Systems Review of Systems  HENT: Negative.    Respiratory: Negative.    Cardiovascular:  Negative for chest pain.  Gastrointestinal:  Negative for abdominal pain, nausea and vomiting.  Neurological:  Positive for headaches. Negative for dizziness and light-headedness.  Psychiatric/Behavioral:  Negative for confusion,  decreased concentration and dysphoric mood.     Physical Exam Triage Vital Signs ED Triage Vitals  Enc Vitals Group     BP 05/11/21 1714 (!) 196/105     Pulse Rate 05/11/21 1712 89     Resp 05/11/21 1712 16     Temp 05/11/21 1712 98.5 F (36.9 C)     Temp Source 05/11/21 1712 Oral     SpO2 05/11/21 1712 98 %     Weight --      Height --      Head Circumference --      Peak Flow --      Pain Score 05/11/21 1709 2     Pain Loc --      Pain Edu? --      Excl. in GC? --    No data found.  Updated Vital Signs BP (!) 196/105 (BP Location: Right Arm)    Pulse 89    Temp 98.5 F (36.9 C) (Oral)    Resp 16    LMP 04/20/2015    SpO2 98%   Visual  Acuity Right Eye Distance:   Left Eye Distance:   Bilateral Distance:    Right Eye Near:   Left Eye Near:    Bilateral Near:     Physical Exam Vitals and nursing note reviewed.  Constitutional:      General: She is not in acute distress.    Appearance: She is not ill-appearing.  HENT:     Right Ear: Tympanic membrane normal.     Left Ear: Tympanic membrane normal.  Cardiovascular:     Rate and Rhythm: Normal rate and regular rhythm.  Pulmonary:     Effort: Pulmonary effort is normal.     Breath sounds: Normal breath sounds.  Abdominal:     General: Abdomen is flat.  Musculoskeletal:        General: Normal range of motion.  Skin:    General: Skin is warm.     Comments: 1 inch laceration over the frontal scalp.  Laceration is superficial.  It is well opposed.  No bleeding.  No surrounding hematoma or induration.  Neurological:     General: No focal deficit present.     Mental Status: She is alert and oriented to person, place, and time.     Cranial Nerves: No cranial nerve deficit.     Sensory: No sensory deficit.     UC Treatments / Results  Labs (all labs ordered are listed, but only abnormal results are displayed) Labs Reviewed - No data to display  EKG   Radiology No results found.  Procedures Procedures (including critical care time)  Medications Ordered in UC Medications - No data to display  Initial Impression / Assessment and Plan / UC Course  I have reviewed the triage vital signs and the nursing notes.  Pertinent labs & imaging results that were available during my care of the patient were reviewed by me and considered in my medical decision making (see chart for details).     1.  Left frontal scalp laceration: No indication for suturing at this time Laceration is superficial Topical antibiotic Okay to wash with mild soap and water Return precautions given No signs or symptoms of concussion Final Clinical Impressions(s) / UC Diagnoses    Final diagnoses:  Scalp laceration, initial encounter  Elevated blood pressure reading in office with white coat syndrome, without diagnosis of hypertension     Discharge Instructions  No indication for suturing at this time Okay to wash hair with mild soap and water If he notices any worsening pain, redness, discharge please return to urgent care to be reevaluated If you have worsening global headache, blurry vision, nausea, vomiting and confusion please go to the emergency room to be evaluated for concussion. At this time, you do not have any signs or symptoms of a concussion.   ED Prescriptions   None    PDMP not reviewed this encounter.   Merrilee JanskyLamptey, Karoline Fleer O, MD 05/12/21 [id number]    Merrilee JanskyLamptey, Rozelle Caudle O, MD 05/12/21 [phone]

## 2021-06-12 ENCOUNTER — Ambulatory Visit (HOSPITAL_COMMUNITY): Payer: Self-pay | Admitting: Psychiatry

## 2021-06-22 ENCOUNTER — Other Ambulatory Visit: Payer: Self-pay

## 2021-06-22 ENCOUNTER — Ambulatory Visit (INDEPENDENT_AMBULATORY_CARE_PROVIDER_SITE_OTHER): Payer: Self-pay | Admitting: Psychiatry

## 2021-06-22 DIAGNOSIS — F411 Generalized anxiety disorder: Secondary | ICD-10-CM

## 2021-06-22 DIAGNOSIS — F331 Major depressive disorder, recurrent, moderate: Secondary | ICD-10-CM

## 2021-06-22 NOTE — Progress Notes (Signed)
° °  THERAPIST PROGRESS NOTE  Session Time: Thursday 06/22/2021 4:10 PM -  5:00 PM        Participation Level: Active  Behavioral Response: CasualAlertAnxious/Depressed  Type of Therapy: Individual Therapy  Treatment Goals addressed: Ability to handle effectively the full variety of life's anxieties AEB patient learning and implementing conflict resolution skills to resolve interpersonal problems/express needs and concerns assertively to ex-husband per patient's report patient indecisive about choices regarding medications in restaurants per patient's report   Interventions: CBT and Supportive  Summary: Gabriela Schultz is a 56 y.o. female who presents with a longstanding history of symptoms of anxiety and depression.  She is a returning patient to this clinician as she has been in and treatment intermittently since 2014.  Current stressors include mother having dementia that was exacerbated by COVID-19.  Patient reports trying to move her mother and father to a skilled nursing facility.  They are currently living with her sister with whom patient reports a tense relationship.  They recently had conflict and patient has not seen her mother since that time.  Patient states feeling as though she lost her parents when they moved in with her sister.  Patient last was seen via virtual visit about 9 months ago.  She reports she did not schedule earlier appointment as she did not have insurance.  However, those issues have been resolved.  Patient reports several stressors in the past several months including the death of her mother in 03-Apr-2021, discord in relationship with her sister, and strained relationship with her father.  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  She used assertiveness and set boundaries with the workers.  Two left voluntarily and patient fired one of the workers.   Patient reports depressed mood, excessive worry, diminished interest and pleasure in activities, fatigue, poor appetite, negative thoughts about self and restlessness.  She recently contacted her psychiatrist, Dr. Robina Ade, who adjusted patient's medication.  Patient continues to report feeling overwhelmed.  She also reports feeling betrayed along with anger and states feeling lost.    Suicidal/Homicidal: Nowithout intent/plan  Therapist Response: Reviewed symptoms, administered PHQ 2 and 9 and GAD-7, discussed stressors, facilitated expression of thoughts and feelings, validated feelings, normalized feelings related to grief and loss, praised and reinforced patient's efforts regarding using assertiveness skills/setting-maintaining limits, encouraged patient to continue working with psychiatrist Dr. Toy Care, began to discuss next steps for treatment   Plan: Return again in 2 weeks.  Diagnosis: Axis I: Generalized Anxiety Disorder        Alonza Smoker, LCSW 06/22/2021

## 2022-01-15 DIAGNOSIS — F422 Mixed obsessional thoughts and acts: Secondary | ICD-10-CM | POA: Diagnosis not present

## 2022-01-15 DIAGNOSIS — F3342 Major depressive disorder, recurrent, in full remission: Secondary | ICD-10-CM | POA: Diagnosis not present

## 2022-01-15 DIAGNOSIS — F5101 Primary insomnia: Secondary | ICD-10-CM | POA: Diagnosis not present

## 2022-01-15 DIAGNOSIS — F410 Panic disorder [episodic paroxysmal anxiety] without agoraphobia: Secondary | ICD-10-CM | POA: Diagnosis not present

## 2022-02-21 ENCOUNTER — Other Ambulatory Visit: Payer: Self-pay | Admitting: Internal Medicine

## 2022-02-21 DIAGNOSIS — Z1231 Encounter for screening mammogram for malignant neoplasm of breast: Secondary | ICD-10-CM

## 2022-02-26 DIAGNOSIS — Z0000 Encounter for general adult medical examination without abnormal findings: Secondary | ICD-10-CM | POA: Diagnosis not present

## 2022-02-26 DIAGNOSIS — R7303 Prediabetes: Secondary | ICD-10-CM | POA: Diagnosis not present

## 2022-02-26 DIAGNOSIS — Z23 Encounter for immunization: Secondary | ICD-10-CM | POA: Diagnosis not present

## 2022-02-26 DIAGNOSIS — R030 Elevated blood-pressure reading, without diagnosis of hypertension: Secondary | ICD-10-CM | POA: Diagnosis not present

## 2022-02-26 DIAGNOSIS — Z13220 Encounter for screening for lipoid disorders: Secondary | ICD-10-CM | POA: Diagnosis not present

## 2022-02-26 DIAGNOSIS — F419 Anxiety disorder, unspecified: Secondary | ICD-10-CM | POA: Diagnosis not present

## 2022-04-12 ENCOUNTER — Ambulatory Visit
Admission: RE | Admit: 2022-04-12 | Discharge: 2022-04-12 | Disposition: A | Discharge status: Home / Self Care | Payer: BC Managed Care – PPO | Source: Ambulatory Visit | Attending: Internal Medicine | Admitting: Internal Medicine

## 2022-04-12 DIAGNOSIS — Z1231 Encounter for screening mammogram for malignant neoplasm of breast: Secondary | ICD-10-CM | POA: Diagnosis not present

## 2022-05-17 DIAGNOSIS — Z01419 Encounter for gynecological examination (general) (routine) without abnormal findings: Secondary | ICD-10-CM | POA: Diagnosis not present

## 2022-05-22 DIAGNOSIS — R9431 Abnormal electrocardiogram [ECG] [EKG]: Secondary | ICD-10-CM | POA: Diagnosis not present

## 2022-05-22 DIAGNOSIS — I10 Essential (primary) hypertension: Secondary | ICD-10-CM | POA: Diagnosis not present

## 2022-05-30 DIAGNOSIS — R9431 Abnormal electrocardiogram [ECG] [EKG]: Secondary | ICD-10-CM | POA: Diagnosis not present

## 2022-05-30 DIAGNOSIS — I10 Essential (primary) hypertension: Secondary | ICD-10-CM | POA: Diagnosis not present

## 2022-07-10 DIAGNOSIS — F422 Mixed obsessional thoughts and acts: Secondary | ICD-10-CM | POA: Diagnosis not present

## 2022-07-10 DIAGNOSIS — F410 Panic disorder [episodic paroxysmal anxiety] without agoraphobia: Secondary | ICD-10-CM | POA: Diagnosis not present

## 2022-07-10 DIAGNOSIS — F331 Major depressive disorder, recurrent, moderate: Secondary | ICD-10-CM | POA: Diagnosis not present

## 2022-08-01 DIAGNOSIS — M545 Low back pain, unspecified: Secondary | ICD-10-CM | POA: Diagnosis not present

## 2022-08-01 DIAGNOSIS — M7918 Myalgia, other site: Secondary | ICD-10-CM | POA: Diagnosis not present

## 2022-08-01 DIAGNOSIS — M2550 Pain in unspecified joint: Secondary | ICD-10-CM | POA: Diagnosis not present

## 2022-08-01 DIAGNOSIS — G8929 Other chronic pain: Secondary | ICD-10-CM | POA: Diagnosis not present

## 2022-08-24 ENCOUNTER — Ambulatory Visit (HOSPITAL_COMMUNITY)
Admission: RE | Admit: 2022-08-24 | Discharge: 2022-08-24 | Disposition: A | Discharge status: Home / Self Care | Payer: BC Managed Care – PPO | Source: Ambulatory Visit | Attending: Internal Medicine | Admitting: Internal Medicine

## 2022-08-24 ENCOUNTER — Other Ambulatory Visit (HOSPITAL_COMMUNITY): Payer: Self-pay | Admitting: Internal Medicine

## 2022-08-24 DIAGNOSIS — M545 Low back pain, unspecified: Secondary | ICD-10-CM | POA: Diagnosis not present

## 2022-08-28 DIAGNOSIS — R7303 Prediabetes: Secondary | ICD-10-CM | POA: Diagnosis not present

## 2022-08-28 DIAGNOSIS — F419 Anxiety disorder, unspecified: Secondary | ICD-10-CM | POA: Diagnosis not present

## 2022-08-28 DIAGNOSIS — M545 Low back pain, unspecified: Secondary | ICD-10-CM | POA: Diagnosis not present

## 2022-08-28 DIAGNOSIS — I10 Essential (primary) hypertension: Secondary | ICD-10-CM | POA: Diagnosis not present

## 2023-05-09 DIAGNOSIS — F410 Panic disorder [episodic paroxysmal anxiety] without agoraphobia: Secondary | ICD-10-CM | POA: Diagnosis not present

## 2023-05-09 DIAGNOSIS — F331 Major depressive disorder, recurrent, moderate: Secondary | ICD-10-CM | POA: Diagnosis not present

## 2023-05-09 DIAGNOSIS — F5101 Primary insomnia: Secondary | ICD-10-CM | POA: Diagnosis not present

## 2023-05-09 DIAGNOSIS — F422 Mixed obsessional thoughts and acts: Secondary | ICD-10-CM | POA: Diagnosis not present

## 2023-05-16 DIAGNOSIS — F320 Major depressive disorder, single episode, mild: Secondary | ICD-10-CM | POA: Diagnosis not present

## 2023-05-23 ENCOUNTER — Other Ambulatory Visit: Payer: Self-pay | Admitting: Obstetrics and Gynecology

## 2023-05-23 DIAGNOSIS — Z01419 Encounter for gynecological examination (general) (routine) without abnormal findings: Secondary | ICD-10-CM | POA: Diagnosis not present

## 2023-05-23 DIAGNOSIS — Z1151 Encounter for screening for human papillomavirus (HPV): Secondary | ICD-10-CM | POA: Diagnosis not present

## 2023-05-23 DIAGNOSIS — Z124 Encounter for screening for malignant neoplasm of cervix: Secondary | ICD-10-CM | POA: Diagnosis not present

## 2023-05-23 DIAGNOSIS — F320 Major depressive disorder, single episode, mild: Secondary | ICD-10-CM | POA: Diagnosis not present

## 2023-05-23 DIAGNOSIS — Z1231 Encounter for screening mammogram for malignant neoplasm of breast: Secondary | ICD-10-CM

## 2023-05-30 DIAGNOSIS — F320 Major depressive disorder, single episode, mild: Secondary | ICD-10-CM | POA: Diagnosis not present

## 2023-06-06 ENCOUNTER — Ambulatory Visit: Payer: BC Managed Care – PPO

## 2023-06-06 DIAGNOSIS — F320 Major depressive disorder, single episode, mild: Secondary | ICD-10-CM | POA: Diagnosis not present

## 2023-06-11 DIAGNOSIS — F419 Anxiety disorder, unspecified: Secondary | ICD-10-CM | POA: Diagnosis not present

## 2023-06-11 DIAGNOSIS — R7303 Prediabetes: Secondary | ICD-10-CM | POA: Diagnosis not present

## 2023-06-11 DIAGNOSIS — I10 Essential (primary) hypertension: Secondary | ICD-10-CM | POA: Diagnosis not present

## 2023-06-11 DIAGNOSIS — E663 Overweight: Secondary | ICD-10-CM | POA: Diagnosis not present

## 2023-06-11 DIAGNOSIS — Z0000 Encounter for general adult medical examination without abnormal findings: Secondary | ICD-10-CM | POA: Diagnosis not present

## 2023-06-12 ENCOUNTER — Ambulatory Visit (HOSPITAL_COMMUNITY)
Admission: RE | Admit: 2023-06-12 | Discharge: 2023-06-12 | Disposition: A | Discharge status: Home / Self Care | Payer: BC Managed Care – PPO | Source: Ambulatory Visit | Attending: Obstetrics and Gynecology | Admitting: Obstetrics and Gynecology

## 2023-06-12 ENCOUNTER — Encounter (HOSPITAL_COMMUNITY): Payer: Self-pay

## 2023-06-12 DIAGNOSIS — Z1231 Encounter for screening mammogram for malignant neoplasm of breast: Secondary | ICD-10-CM | POA: Diagnosis not present

## 2023-06-13 DIAGNOSIS — F320 Major depressive disorder, single episode, mild: Secondary | ICD-10-CM | POA: Diagnosis not present

## 2023-06-25 DIAGNOSIS — Z1321 Encounter for screening for nutritional disorder: Secondary | ICD-10-CM | POA: Diagnosis not present

## 2023-06-25 DIAGNOSIS — N951 Menopausal and female climacteric states: Secondary | ICD-10-CM | POA: Diagnosis not present

## 2023-06-25 DIAGNOSIS — Z79890 Hormone replacement therapy (postmenopausal): Secondary | ICD-10-CM | POA: Diagnosis not present

## 2023-06-28 DIAGNOSIS — F3341 Major depressive disorder, recurrent, in partial remission: Secondary | ICD-10-CM | POA: Diagnosis not present

## 2023-06-28 DIAGNOSIS — F5101 Primary insomnia: Secondary | ICD-10-CM | POA: Diagnosis not present

## 2023-06-28 DIAGNOSIS — F422 Mixed obsessional thoughts and acts: Secondary | ICD-10-CM | POA: Diagnosis not present

## 2023-06-28 DIAGNOSIS — F410 Panic disorder [episodic paroxysmal anxiety] without agoraphobia: Secondary | ICD-10-CM | POA: Diagnosis not present

## 2023-09-19 DIAGNOSIS — R3989 Other symptoms and signs involving the genitourinary system: Secondary | ICD-10-CM | POA: Diagnosis not present

## 2023-09-19 DIAGNOSIS — Z79890 Hormone replacement therapy (postmenopausal): Secondary | ICD-10-CM | POA: Diagnosis not present

## 2023-10-23 DIAGNOSIS — D0439 Carcinoma in situ of skin of other parts of face: Secondary | ICD-10-CM | POA: Diagnosis not present

## 2023-10-23 DIAGNOSIS — Z1283 Encounter for screening for malignant neoplasm of skin: Secondary | ICD-10-CM | POA: Diagnosis not present

## 2023-10-23 DIAGNOSIS — D485 Neoplasm of uncertain behavior of skin: Secondary | ICD-10-CM | POA: Diagnosis not present

## 2023-12-16 DIAGNOSIS — D0439 Carcinoma in situ of skin of other parts of face: Secondary | ICD-10-CM | POA: Diagnosis not present

## 2023-12-18 DIAGNOSIS — D0439 Carcinoma in situ of skin of other parts of face: Secondary | ICD-10-CM | POA: Diagnosis not present

## 2023-12-19 DIAGNOSIS — D0439 Carcinoma in situ of skin of other parts of face: Secondary | ICD-10-CM | POA: Diagnosis not present

## 2023-12-20 DIAGNOSIS — F3342 Major depressive disorder, recurrent, in full remission: Secondary | ICD-10-CM | POA: Diagnosis not present

## 2023-12-20 DIAGNOSIS — F422 Mixed obsessional thoughts and acts: Secondary | ICD-10-CM | POA: Diagnosis not present

## 2023-12-20 DIAGNOSIS — F5101 Primary insomnia: Secondary | ICD-10-CM | POA: Diagnosis not present

## 2023-12-20 DIAGNOSIS — F410 Panic disorder [episodic paroxysmal anxiety] without agoraphobia: Secondary | ICD-10-CM | POA: Diagnosis not present

## 2023-12-23 DIAGNOSIS — D0439 Carcinoma in situ of skin of other parts of face: Secondary | ICD-10-CM | POA: Diagnosis not present

## 2023-12-24 DIAGNOSIS — D0439 Carcinoma in situ of skin of other parts of face: Secondary | ICD-10-CM | POA: Diagnosis not present

## 2023-12-25 DIAGNOSIS — D0439 Carcinoma in situ of skin of other parts of face: Secondary | ICD-10-CM | POA: Diagnosis not present

## 2023-12-26 DIAGNOSIS — D0439 Carcinoma in situ of skin of other parts of face: Secondary | ICD-10-CM | POA: Diagnosis not present

## 2023-12-28 DIAGNOSIS — D0439 Carcinoma in situ of skin of other parts of face: Secondary | ICD-10-CM | POA: Diagnosis not present

## 2023-12-30 DIAGNOSIS — D0439 Carcinoma in situ of skin of other parts of face: Secondary | ICD-10-CM | POA: Diagnosis not present

## 2023-12-31 DIAGNOSIS — D0439 Carcinoma in situ of skin of other parts of face: Secondary | ICD-10-CM | POA: Diagnosis not present

## 2024-01-01 DIAGNOSIS — D0439 Carcinoma in situ of skin of other parts of face: Secondary | ICD-10-CM | POA: Diagnosis not present

## 2024-01-02 DIAGNOSIS — D0439 Carcinoma in situ of skin of other parts of face: Secondary | ICD-10-CM | POA: Diagnosis not present

## 2024-01-07 DIAGNOSIS — D0439 Carcinoma in situ of skin of other parts of face: Secondary | ICD-10-CM | POA: Diagnosis not present

## 2024-01-08 DIAGNOSIS — D0439 Carcinoma in situ of skin of other parts of face: Secondary | ICD-10-CM | POA: Diagnosis not present

## 2024-01-09 DIAGNOSIS — D0439 Carcinoma in situ of skin of other parts of face: Secondary | ICD-10-CM | POA: Diagnosis not present

## 2024-01-11 DIAGNOSIS — D0439 Carcinoma in situ of skin of other parts of face: Secondary | ICD-10-CM | POA: Diagnosis not present

## 2024-01-13 DIAGNOSIS — D0439 Carcinoma in situ of skin of other parts of face: Secondary | ICD-10-CM | POA: Diagnosis not present

## 2024-01-14 DIAGNOSIS — D0439 Carcinoma in situ of skin of other parts of face: Secondary | ICD-10-CM | POA: Diagnosis not present

## 2024-01-15 DIAGNOSIS — D0439 Carcinoma in situ of skin of other parts of face: Secondary | ICD-10-CM | POA: Diagnosis not present

## 2024-01-16 DIAGNOSIS — D0439 Carcinoma in situ of skin of other parts of face: Secondary | ICD-10-CM | POA: Diagnosis not present

## 2024-01-20 DIAGNOSIS — D0439 Carcinoma in situ of skin of other parts of face: Secondary | ICD-10-CM | POA: Diagnosis not present

## 2024-01-21 DIAGNOSIS — D0439 Carcinoma in situ of skin of other parts of face: Secondary | ICD-10-CM | POA: Diagnosis not present

## 2024-01-22 DIAGNOSIS — D0439 Carcinoma in situ of skin of other parts of face: Secondary | ICD-10-CM | POA: Diagnosis not present

## 2024-01-23 DIAGNOSIS — D0439 Carcinoma in situ of skin of other parts of face: Secondary | ICD-10-CM | POA: Diagnosis not present

## 2024-02-02 DIAGNOSIS — J0140 Acute pansinusitis, unspecified: Secondary | ICD-10-CM | POA: Diagnosis not present

## 2024-02-02 DIAGNOSIS — J209 Acute bronchitis, unspecified: Secondary | ICD-10-CM | POA: Diagnosis not present

## 2024-03-27 ENCOUNTER — Ambulatory Visit
Admission: EM | Admit: 2024-03-27 | Discharge: 2024-03-27 | Disposition: A | Discharge status: Home / Self Care | Payer: Self-pay | Attending: Nurse Practitioner | Admitting: Nurse Practitioner

## 2024-03-27 DIAGNOSIS — J358 Other chronic diseases of tonsils and adenoids: Secondary | ICD-10-CM

## 2024-03-27 DIAGNOSIS — J069 Acute upper respiratory infection, unspecified: Secondary | ICD-10-CM

## 2024-03-27 HISTORY — DX: Carcinoma in situ of skin of other parts of face: D04.39

## 2024-03-27 LAB — POC COVID19/FLU A&B COMBO
Covid Antigen, POC: NEGATIVE
Influenza A Antigen, POC: NEGATIVE
Influenza B Antigen, POC: NEGATIVE

## 2024-03-27 LAB — POCT RAPID STREP A (OFFICE): Rapid Strep A Screen: NEGATIVE

## 2024-03-27 MED ORDER — CETIRIZINE HCL 10 MG PO TABS: 10.0000 mg | ORAL_TABLET | Freq: Every day | ORAL | 0 refills | Status: AC

## 2024-03-27 MED ORDER — FLUTICASONE PROPIONATE 50 MCG/ACT NA SUSP: 2.0000 | Freq: Every day | NASAL | 0 refills | Status: AC

## 2024-03-27 MED ORDER — LIDOCAINE VISCOUS HCL 2 % MT SOLN: OROMUCOSAL | 0 refills | Status: AC

## 2024-03-27 NOTE — Discharge Instructions (Addendum)
 The rapid strep test and COVID/flu test were negative. Take medication as prescribed. Increase fluids and allow for plenty of rest. Recommend warm salt water gargles 3-4 times daily as needed for throat pain or discomfort. Good oral hygiene while symptoms persist. You may also use normal saline nasal spray throughout the day for nasal congestion or runny nose. Symptoms should begin to improve over the next 5 to 7 days.  If symptoms fail to improve, or begin to worsen, you may follow-up in this clinic or with your primary care physician for further evaluation. If your symptoms fail to improve, or begin to worsen, recommend follow-up with your primary care physician for further evaluation. Follow-up as needed. Follow-up as needed.

## 2024-03-27 NOTE — ED Provider Notes (Signed)
 [facility]    CSN: [id number] Arrival date & time: 03/27/24  1609      History   Chief Complaint No chief complaint on file.   HPI Gabriela Schultz is a 58 y.o. female.   The history is provided by the patient.   Patient presents with a 3-day history of headache, ear discomfort, and a spot on the back of her throat.  Patient denies fever, chills, ear drainage, wheezing, difficulty breathing, abdominal pain, nausea, vomiting, diarrhea, or rash.  She denies any obvious close sick contacts.  States she has been taking Tylenol  for her symptoms.  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      Home Medications    Prior to Admission medications   Medication Sig Start Date End Date Taking? Authorizing Provider  cetirizine  (ZYRTEC ) 10 MG tablet Take 1 tablet (10 mg total) by mouth daily. 03/27/24  Yes Leath-Warren, Etta PARAS, NP  fluticasone  (FLONASE ) 50 MCG/ACT nasal spray Place 2 sprays into both nostrils daily. 03/27/24  Yes Leath-Warren, Etta PARAS, NP  lidocaine  (XYLOCAINE ) 2 % solution Gargle and spit 5 mL every 6 hours as needed for throat pain or discomfort. 03/27/24  Yes Leath-Warren, Etta PARAS, NP  losartan-hydrochlorothiazide (HYZAAR) 50-12.5 MG tablet Take 1 tablet by mouth daily.   Yes [provider]  ALPRAZolam (XANAX) 1 MG tablet Take 0.25 mg by mouth daily as needed for anxiety. For anxiety    [provider]  calcium carbonate (TUMS - DOSED IN MG ELEMENTAL CALCIUM) 500 MG chewable tablet Chew 1 tablet by  mouth daily as needed for heartburn. Reported on 11/14/2015    [provider]  FLUoxetine (PROZAC) 20 MG capsule Take 60 mg by mouth at bedtime.    [provider]  Multiple Vitamin (MULTIVITAMIN WITH MINERALS) TABS tablet Take 1 tablet by mouth daily.    [provider]  omeprazole (PRILOSEC) 10 MG capsule Take 10 mg by mouth daily.    [provider]    Family History Family History  Problem Relation Age of Onset   Anxiety disorder Mother    Anxiety disorder Maternal Aunt    Depression Maternal Grandmother    Depression Paternal Grandmother    Anxiety disorder Daughter     Social History Social History   Tobacco Use   Smoking status: Never   Smokeless tobacco: Never  Vaping Use   Vaping status: Never Used  Substance Use Topics   Alcohol use: Yes    Comment: 1/2 glass of wine per month   Drug use: No     Allergies   Benadryl  [diphenhydramine ], Doxycycline, Erythromycin, Flagyl [metronidazole], Latex, and Vicodin [hydrocodone-acetaminophen ]   Review of Systems Review of Systems Per HPI  Physical Exam Triage Vital Signs ED Triage Vitals  Encounter Vitals Group     BP 03/27/24 1639 (!) 158/91     Girls Systolic BP Percentile --      Girls Diastolic BP Percentile --  Boys Systolic BP Percentile --      Boys Diastolic BP Percentile --      Pulse Rate 03/27/24 1639 82     Resp 03/27/24 1639 16     Temp 03/27/24 1639 98.7 F (37.1 C)     Temp Source 03/27/24 1639 Oral     SpO2 03/27/24 1639 97 %     Weight --      Height --      Head Circumference --      Peak Flow --      Pain Score 03/27/24 1642 0     Pain Loc --      Pain Education --      Exclude from Growth Chart --    No data found.  Updated Vital Signs BP (!) 158/91 (BP Location: Right Arm)   Pulse 82   Temp 98.7 F (37.1 C) (Oral)   Resp 16   LMP 04/20/2015   SpO2 97%   Visual Acuity Right Eye Distance:   Left Eye Distance:   Bilateral Distance:     Right Eye Near:   Left Eye Near:    Bilateral Near:     Physical Exam Vitals and nursing note reviewed.  Constitutional:      General: She is not in acute distress.    Appearance: Normal appearance.  HENT:     Head: Normocephalic.     Right Ear: Tympanic membrane, ear canal and external ear normal.     Left Ear: Tympanic membrane, ear canal and external ear normal.     Nose: Nose normal.     Right Turbinates: Enlarged and swollen.     Left Turbinates: Enlarged and swollen.     Right Sinus: No maxillary sinus tenderness or frontal sinus tenderness.     Left Sinus: No maxillary sinus tenderness or frontal sinus tenderness.     Mouth/Throat:     Mouth: Mucous membranes are moist.     Pharynx: Uvula midline. Postnasal drip present. No pharyngeal swelling, oropharyngeal exudate, posterior oropharyngeal erythema or uvula swelling.     Comments: Tonsil stone noted to left tonsil. Cobblestoning present to posterior oropharynx  Eyes:     Extraocular Movements: Extraocular movements intact.     Conjunctiva/sclera: Conjunctivae normal.     Pupils: Pupils are equal, round, and reactive to light.  Cardiovascular:     Rate and Rhythm: Normal rate and regular rhythm.     Pulses: Normal pulses.     Heart sounds: Normal heart sounds.  Pulmonary:     Effort: Pulmonary effort is normal. No respiratory distress.     Breath sounds: Normal breath sounds. No stridor. No wheezing, rhonchi or rales.  Musculoskeletal:     Cervical back: Normal range of motion.  Skin:    General: Skin is warm and dry.  Neurological:     General: No focal deficit present.     Mental Status: She is alert and oriented to person, place, and time.  Psychiatric:        Mood and Affect: Mood normal.        Behavior: Behavior normal.      UC Treatments / Results  Labs (all labs ordered are listed, but only abnormal results are displayed) Labs Reviewed  POC COVID19/FLU A&B COMBO  POCT RAPID STREP A (OFFICE)     EKG   Radiology No results found.  Procedures Procedures (including critical care time)  Medications Ordered in UC Medications - No data to display  Initial Impression / Assessment and Plan / UC Course  I have reviewed the triage vital signs and the nursing notes.  Pertinent labs & imaging results that were available during my care of the patient were reviewed by me and considered in my medical decision making (see chart for details).  The rapid strep test and COVID/flu test were negative.  On exam, the patient's lung sounds are clear throughout, room air sats are at 97%.  She was noted to have a tonsil stone to the left tonsil.  With regard to her upper respiratory symptoms, symptoms consistent with viral etiology.  Will provide symptomatic treatment for throat discomfort with viscous lidocaine  2%, and cetirizine  10 mg and fluticasone  50 mcg nasal spray for upper respiratory symptoms.  Supportive care recommendations were provided and discussed with the patient to include over-the-counter analgesics, warm salt water gargles, and to monitor for worsening symptoms.  Patient was given indications regarding follow-up.  Patient was in agreement with this plan of care and verbalizes understanding.  All questions were answered.  Patient stable for discharge.  Final Clinical Impressions(s) / UC Diagnoses   Final diagnoses:  Viral URI  Tonsil stone     Discharge Instructions      The rapid strep test and COVID/flu test were negative. Take medication as prescribed. Increase fluids and allow for plenty of rest. Recommend warm salt water gargles 3-4 times daily as needed for throat pain or discomfort. Good oral hygiene while symptoms persist. You may also use normal saline nasal spray throughout the day for nasal congestion or runny nose. Symptoms should begin to improve over the next 5 to 7 days.  If symptoms fail to improve, or begin to worsen, you may follow-up in this clinic or  with your primary care physician for further evaluation. If your symptoms fail to improve, or begin to worsen, recommend follow-up with your primary care physician for further evaluation. Follow-up as needed. Follow-up as needed.     ED Prescriptions     Medication Sig Dispense Auth. Provider   lidocaine  (XYLOCAINE ) 2 % solution Gargle and spit 5 mL every 6 hours as needed for throat pain or discomfort. 100 mL Leath-Warren, Etta PARAS, NP   fluticasone  (FLONASE ) 50 MCG/ACT nasal spray Place 2 sprays into both nostrils daily. 16 g Leath-Warren, Etta PARAS, NP   cetirizine  (ZYRTEC ) 10 MG tablet Take 1 tablet (10 mg total) by mouth daily. 30 tablet Leath-Warren, Etta PARAS, NP      PDMP not reviewed this encounter.   Gilmer Etta PARAS, NP 03/27/24 1724

## 2024-03-27 NOTE — ED Triage Notes (Signed)
 Pt reports spot on the back her throat where her tongue is rubbing against, headache ear discomfort x 3 days

## 2024-05-30 ENCOUNTER — Telehealth: Admitting: Family Medicine

## 2024-05-30 DIAGNOSIS — N3000 Acute cystitis without hematuria: Secondary | ICD-10-CM

## 2024-05-30 MED ORDER — NITROFURANTOIN MONOHYD MACRO 100 MG PO CAPS: 100.0000 mg | ORAL_CAPSULE | Freq: Two times a day (BID) | ORAL | 0 refills | Status: AC

## 2024-05-30 NOTE — Patient Instructions (Signed)

## 2024-05-30 NOTE — Progress Notes (Signed)
 " Virtual Visit Consent   Gabriela Schultz, you are scheduled for a virtual visit with a [facility] provider today. Just as with appointments in the office, your consent must be obtained to participate. Your consent will be active for this visit and any virtual visit you may have with one of our providers in the next 365 days. If you have a MyChart account, a copy of this consent can be sent to you electronically.  As this is a virtual visit, video technology does not allow for your provider to perform a traditional examination. This may limit your provider's ability to fully assess your condition. If your provider identifies any concerns that need to be evaluated in person or the need to arrange testing (such as labs, EKG, etc.), we will make arrangements to do so. Although advances in technology are sophisticated, we cannot ensure that it will always work on either your end or our end. If the connection with a video visit is poor, the visit may have to be switched to a telephone visit. With either a video or telephone visit, we are not always able to ensure that we have a secure connection.  By engaging in this virtual visit, you consent to the provision of healthcare and authorize for your insurance to be billed (if applicable) for the services provided during this visit. Depending on your insurance coverage, you may receive a charge related to this service.  I need to obtain your verbal consent now. Are you willing to proceed with your visit today? Gabriela Schultz has provided verbal consent on 05/30/2024 for a virtual visit (video or telephone). Loa Lamp, FNP  Date: 05/30/2024 2:09 PM   Virtual Visit via Video Note   I, Loa Lamp, connected with  Gabriela Schultz  ([id number], March 30, 1966) on 05/30/24 at  2:15 PM EST by a video-enabled telemedicine application and verified that I am speaking with the correct person using two identifiers.  Location: Patient: Virtual Visit Location  Patient: Home Provider: Virtual Visit Location Provider: Home Office   I discussed the limitations of evaluation and management by telemedicine and the availability of in person appointments. The patient expressed understanding and agreed to proceed.    History of Present Illness: Gabriela Schultz is a 59 y.o. who identifies as a female who was assigned female at birth, and is being seen today for burning and frequency on urination, no fever or severe abd pain, sx for 1 days. SABRA  HPI: HPI  Problems: There are no active problems to display for this patient.   Allergies: Allergies[1] Medications: Current Medications[2]  Observations/Objective: Patient is well-developed, well-nourished in no acute distress.  Resting comfortably  at home.  Head is normocephalic, atraumatic.  No labored breathing.  Speech is clear and coherent with logical content.  Patient is alert and oriented at baseline.    Assessment and Plan: 1. Acute cystitis without hematuria (Primary)  Increase fluids, prevention discussed, UC as needed.   Follow Up Instructions: I discussed the assessment and treatment plan with the patient. The patient was provided an opportunity to ask questions and all were answered. The patient agreed with the plan and demonstrated an understanding of the instructions.  A copy of instructions were sent to the patient via MyChart unless otherwise noted below.     The patient was advised to call back or seek an in-person evaluation if the symptoms worsen or if the condition fails to improve as anticipated.    Trammell Bowden, FNP     [  1]  Allergies Allergen Reactions   Benadryl  [Diphenhydramine ]     like someone stabbed me in the heart   Doxycycline    Erythromycin Nausea Only    Upset stomach   Flagyl [Metronidazole] Other (See Comments)    REACTION: nightmares   Latex Itching   Vicodin [Hydrocodone-Acetaminophen ] Nausea Only    Upset stomach  [2]  Current Outpatient  Medications:    nitrofurantoin , macrocrystal-monohydrate, (MACROBID ) 100 MG capsule, Take 1 capsule (100 mg total) by mouth 2 (two) times daily for 7 days., Disp: 14 capsule, Rfl: 0   ALPRAZolam (XANAX) 1 MG tablet, Take 0.25 mg by mouth daily as needed for anxiety. For anxiety, Disp: , Rfl:    calcium carbonate (TUMS - DOSED IN MG ELEMENTAL CALCIUM) 500 MG chewable tablet, Chew 1 tablet by mouth daily as needed for heartburn. Reported on 11/14/2015, Disp: , Rfl:    cetirizine  (ZYRTEC ) 10 MG tablet, Take 1 tablet (10 mg total) by mouth daily., Disp: 30 tablet, Rfl: 0   FLUoxetine (PROZAC) 20 MG capsule, Take 60 mg by mouth at bedtime., Disp: , Rfl:    fluticasone  (FLONASE ) 50 MCG/ACT nasal spray, Place 2 sprays into both nostrils daily., Disp: 16 g, Rfl: 0   lidocaine  (XYLOCAINE ) 2 % solution, Gargle and spit 5 mL every 6 hours as needed for throat pain or discomfort., Disp: 100 mL, Rfl: 0   losartan-hydrochlorothiazide (HYZAAR) 50-12.5 MG tablet, Take 1 tablet by mouth daily., Disp: , Rfl:    Multiple Vitamin (MULTIVITAMIN WITH MINERALS) TABS tablet, Take 1 tablet by mouth daily., Disp: , Rfl:    omeprazole (PRILOSEC) 10 MG capsule, Take 10 mg by mouth daily., Disp: , Rfl:   "
